# Patient Record
Sex: Female | Born: 1995 | Race: Black or African American | Hispanic: No | Marital: Single | Smoking: Never smoker
Health system: Southern US, Community
[De-identification: ages and names within clinical notes are randomized; demographics above are authoritative.]

## PROBLEM LIST (undated history)

## (undated) DIAGNOSIS — N289 Disorder of kidney and ureter, unspecified: Secondary | ICD-10-CM

## (undated) DIAGNOSIS — I10 Essential (primary) hypertension: Secondary | ICD-10-CM

## (undated) DIAGNOSIS — M329 Systemic lupus erythematosus, unspecified: Secondary | ICD-10-CM

## (undated) DIAGNOSIS — I050 Rheumatic mitral stenosis: Secondary | ICD-10-CM

## (undated) HISTORY — PX: CARDIAC SURGERY: SHX584

## (undated) HISTORY — PX: TONSILLECTOMY: SUR1361

## (undated) HISTORY — PX: OTHER SURGICAL HISTORY: SHX169

## (undated) SURGERY — Surgical Case
Anesthesia: *Unknown

---
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
## 2014-02-08 ENCOUNTER — Encounter (HOSPITAL_COMMUNITY): Payer: Self-pay | Admitting: Emergency Medicine

## 2014-02-08 ENCOUNTER — Emergency Department (HOSPITAL_COMMUNITY)
Admission: EM | Admit: 2014-02-08 | Discharge: 2014-02-09 | Disposition: A | Discharge status: Home / Self Care | Payer: Medicaid Other | Attending: Emergency Medicine | Admitting: Emergency Medicine

## 2014-02-08 ENCOUNTER — Emergency Department (HOSPITAL_COMMUNITY): Payer: Medicaid Other

## 2014-02-08 DIAGNOSIS — R51 Headache: Secondary | ICD-10-CM | POA: Insufficient documentation

## 2014-02-08 DIAGNOSIS — Z7952 Long term (current) use of systemic steroids: Secondary | ICD-10-CM | POA: Insufficient documentation

## 2014-02-08 DIAGNOSIS — R079 Chest pain, unspecified: Secondary | ICD-10-CM | POA: Insufficient documentation

## 2014-02-08 DIAGNOSIS — Z79891 Long term (current) use of opiate analgesic: Secondary | ICD-10-CM | POA: Diagnosis not present

## 2014-02-08 DIAGNOSIS — Z79899 Other long term (current) drug therapy: Secondary | ICD-10-CM | POA: Insufficient documentation

## 2014-02-08 DIAGNOSIS — I10 Essential (primary) hypertension: Secondary | ICD-10-CM | POA: Diagnosis not present

## 2014-02-08 DIAGNOSIS — Z7901 Long term (current) use of anticoagulants: Secondary | ICD-10-CM | POA: Insufficient documentation

## 2014-02-08 DIAGNOSIS — M329 Systemic lupus erythematosus, unspecified: Secondary | ICD-10-CM | POA: Diagnosis not present

## 2014-02-08 DIAGNOSIS — R519 Headache, unspecified: Secondary | ICD-10-CM

## 2014-02-08 HISTORY — DX: Essential (primary) hypertension: I10

## 2014-02-08 LAB — CBC
HCT: 35.1 % — ABNORMAL LOW (ref 36.0–46.0)
Hemoglobin: 11.7 g/dL — ABNORMAL LOW (ref 12.0–15.0)
MCH: 29.6 pg (ref 26.0–34.0)
MCHC: 33.3 g/dL (ref 30.0–36.0)
MCV: 88.9 fL (ref 78.0–100.0)
Platelets: 263 10*3/uL (ref 150–400)
RBC: 3.95 MIL/uL (ref 3.87–5.11)
RDW: 14.2 % (ref 11.5–15.5)
WBC: 5 10*3/uL (ref 4.0–10.5)

## 2014-02-08 LAB — BASIC METABOLIC PANEL
Anion gap: 6 (ref 5–15)
BUN: 11 mg/dL (ref 6–23)
CO2: 26 mmol/L (ref 19–32)
CREATININE: 0.84 mg/dL (ref 0.50–1.10)
Calcium: 9.2 mg/dL (ref 8.4–10.5)
Chloride: 111 mEq/L (ref 96–112)
GFR calc Af Amer: >90 mL/min (ref >90)
Glucose, Bld: 112 mg/dL — ABNORMAL HIGH (ref 70–99)
Potassium: 3.5 mmol/L (ref 3.5–5.1)
Sodium: 143 mmol/L (ref 135–145)

## 2014-02-08 LAB — I-STAT TROPONIN, ED: TROPONIN I, POC: 0.01 ng/mL (ref 0.00–0.08)

## 2014-02-08 LAB — PROTIME-INR
INR: 2.1 — AB (ref 0.00–1.49)
Prothrombin Time: 23.8 seconds — ABNORMAL HIGH (ref 11.6–15.2)

## 2014-02-08 LAB — TROPONIN I

## 2014-02-08 MED ORDER — METOCLOPRAMIDE HCL 5 MG/ML IJ SOLN
10.0000 mg | Freq: Once | INTRAMUSCULAR | Status: AC
  Administered 2014-02-08: 10 mg via INTRAVENOUS
  Filled 2014-02-08: qty 2

## 2014-02-08 MED ORDER — DIPHENHYDRAMINE HCL 50 MG/ML IJ SOLN
25.0000 mg | Freq: Once | INTRAMUSCULAR | Status: AC
  Administered 2014-02-08: 25 mg via INTRAVENOUS
  Filled 2014-02-08: qty 1

## 2014-02-08 NOTE — ED Provider Notes (Signed)
CSN: [id number]     Arrival date & time 02/08/14  1900 History   First MD Initiated Contact with Patient 02/08/14 2202     Chief Complaint  Patient presents with  . Chest Pain    Hx of mitral valve stenosis s/p heart surgery.     (Consider location/radiation/quality/duration/timing/severity/associated sxs/prior Treatment) Patient is a 19 y.o. female presenting with chest pain. The history is provided by the patient and a parent. No language interpreter was used.  Chest Pain Pain location:  L chest Pain quality: sharp   Pain radiates to:  Does not radiate Pain radiates to the back: no   Pain severity:  Moderate Onset quality:  Sudden Duration:  4 days Timing:  Intermittent Progression:  Unchanged Chronicity:  Recurrent (Present intermittently for the past 4 years) Relieved by:  Nothing Worsened by:  Nothing tried Ineffective treatments:  None tried Associated symptoms: no abdominal pain, no anxiety, no cough, no dizziness, no fever, no nausea, no numbness, no shortness of breath, not vomiting and no weakness   Risk factors: no aortic disease, no birth control, no coronary artery disease, no Ehlers-Danlos syndrome, no high cholesterol, no hypertension and not female   Risk factors comment:  Lupus and mitral valve replacement 4 years ago   Past Medical History  Diagnosis Date  . Lupus   . Mitral valve stenosis   . Hypertension    Past Surgical History  Procedure Laterality Date  . Value replacement    . Laparoscopic appendectomy N/A 02/11/2013    Procedure: APPENDECTOMY LAPAROSCOPIC;  Surgeon: Liz Malady, MD;  Location: [facility] OR;  Service: General;  Laterality: N/A;  . Laparoscopic salpingo oopherectomy Right 02/11/2013    Procedure: LAPAROSCOPIC SALPINGO OOPHORECTOMY;  Surgeon: Lazaro Arms, MD;  Location: [facility] OR;  Service: Gynecology;  Laterality: Right;  . Tonsillectomy    . Appendectomy  January 2015  . Esophagogastroduodenoscopy N/A 02/14/2013    Procedure:  ESOPHAGOGASTRODUODENOSCOPY (EGD);  Surgeon: Hart Carwin, MD;  Location: [facility] ENDOSCOPY;  Service: Endoscopy;  Laterality: N/A;  . Cardiac surgery     No family history on file. History  Substance Use Topics  . Smoking status: Never Smoker   . Smokeless tobacco: Never Used  . Alcohol Use: No   OB History    No data available     Review of Systems  Constitutional: Negative for fever.  Respiratory: Negative for cough and shortness of breath.   Cardiovascular: Positive for chest pain.  Gastrointestinal: Negative for nausea, vomiting and abdominal pain.  Neurological: Negative for dizziness, weakness and numbness.  All other systems reviewed and are negative.     Allergies  Review of patient's allergies indicates no known allergies.  Home Medications   Prior to Admission medications   Medication Sig Start Date End Date Taking? Authorizing Provider  amLODipine (NORVASC) 10 MG tablet Take 10 mg by mouth daily.    Historical Provider, MD  amoxicillin-clavulanate (AUGMENTIN) 500-125 MG per tablet Take 1 tablet (500 mg total) by mouth every 8 (eight) hours. 04/22/13   Nelia Shi, MD  Ascorbic Acid (VITAMIN C PO) Take 1 tablet by mouth daily.    Historical Provider, MD  hydroxychloroquine (PLAQUENIL) 200 MG tablet Take 400 mg by mouth daily.    Historical Provider, MD  mycophenolate (MYFORTIC) 360 MG TBEC EC tablet Take 1,080 mg by mouth 2 (two) times daily.    Historical Provider, MD  oxyCODONE-acetaminophen (PERCOCET/ROXICET) 5-325 MG per tablet Take 1 tablet by mouth every  4 (four) hours as needed. 04/23/13   Garlon Hatchet, PA-C  predniSONE (DELTASONE) 5 MG tablet Take 5 mg by mouth daily with breakfast.    Historical Provider, MD  warfarin (COUMADIN) 7.5 MG tablet Take 7.5-11.25 mg by mouth every evening. 7.5mg  Sunday, MOnday, Wednesday, Friday, Saturday 11.25mg  Tuesday, Thursday    Historical Provider, MD   BP 133/85 mmHg  Pulse 88  Temp(Src) 98.4 F (36.9 C) (Oral)  Resp  16  Ht 5\' 2"  (1.575 m)  Wt 180 lb (81.647 kg)  BMI 32.91 kg/m2  SpO2 100%  LMP 01/25/2014 (Approximate) Physical Exam  Constitutional: She is oriented to person, place, and time. She appears well-developed and well-nourished. No distress.  HENT:  Head: Normocephalic and atraumatic.  Eyes: Pupils are equal, round, and reactive to light.  Neck: Normal range of motion.  Cardiovascular: Normal rate, regular rhythm, normal heart sounds and intact distal pulses.   Pulses:      Radial pulses are 2+ on the right side, and 2+ on the left side.  Pulmonary/Chest: Effort normal. No respiratory distress. She has no wheezes. She exhibits no tenderness.  Abdominal: Soft. Bowel sounds are normal. She exhibits no distension. There is no tenderness. There is no rebound and no guarding.  Neurological: She is alert and oriented to person, place, and time. She has normal strength. No cranial nerve deficit or sensory deficit. She exhibits normal muscle tone. Coordination and gait normal.  Skin: Skin is warm and dry.  Nursing note and vitals reviewed.   ED Course  Procedures (including critical care time) Labs Review Labs Reviewed  CBC - Abnormal; Notable for the following:    Hemoglobin 11.7 (*)    HCT 35.1 (*)    All other components within normal limits  BASIC METABOLIC PANEL - Abnormal; Notable for the following:    Glucose, Bld 112 (*)    All other components within normal limits  PROTIME-INR - Abnormal; Notable for the following:    Prothrombin Time 23.8 (*)    INR 2.10 (*)    All other components within normal limits  TROPONIN I  I-STAT TROPOININ, ED    Imaging Review Dg Chest 2 View  02/08/2014   CLINICAL DATA:  Left-sided chest pain radiating into the left shoulder over the past 3 days. Prior mitral valve replacement for mitral stenosis. Current history of hypertension and collagen vascular disease.  EXAM: CHEST  2 VIEW  COMPARISON:  02/16/2013 dating back to 02/10/2013.  FINDINGS:  Suboptimal inspiration due to body habitus accounts for crowded bronchovascular markings, especially in the bases, and accentuates the cardiac silhouette. Taking this into account, cardiac silhouette upper normal in size, unchanged. Prior mitral valve replacement. Hilar and mediastinal contours unremarkable. Lungs clear. Bronchovascular markings normal. Pulmonary vascularity normal. No visible pleural effusions. No pneumothorax. Visualized bony thorax intact.  IMPRESSION: Suboptimal inspiration.  No acute cardiopulmonary disease.   Electronically Signed   By: 02/12/2013 M.D.   On: 02/08/2014 23:19     EKG Interpretation   Date/Time:  Friday February 08 2014 19:10:26 EST Ventricular Rate:  109 PR Interval:  152 QRS Duration: 74 QT Interval:  328 QTC Calculation: 441 R Axis:   55 Text Interpretation:  Sinus tachycardia Possible Left atrial enlargement  Nonspecific ST and T wave abnormality No significant change since last  tracing Confirmed by 05-11-1981  MD, Anitra Lauth (Alphonzo Lemmings) on 02/08/2014 9:57:15 PM      MDM   Final diagnoses:  Chest pain  Nonintractable headache,  unspecified chronicity pattern, unspecified headache type     Patient is a 19 year old African-American female with pertinent past medical history of lupus and a mitral valve replacement 3 years ago on Coumadin for anticoagulation who comes to the emergency Department today with left-sided chest pain which has been intermittent for the past 4 years. Physical exam as above. Initial workup included CBC, BMP, troponin, PT/INR, EKG, chest x-ray. EKG is detailed above. There is no significant change from previous EKGs. The patient has had this pain intermittently for the past several years, the pain is not exertional, and has been present for the past 4 days as a result a single troponin should be sufficient to rule out MI. Initial troponin was negative.  Since the patient is young with atypical symptoms I doubt an MI. BMP was  unremarkable. CBC had hemoglobin of 11.7.  Doubt anemia contributing to her symptoms. INR was therapeutic at 2.1. Patient has never had a DVT or PE. She was initially tachycardic upon arrival however this resolved without intervention. With a therapeutic INR, atypical symptoms, no shortness of breath, I doubt PE at this time. Patient is followed at [facility] for her mitral valve repair. She has follow-up within the next couple months. She was instructed to contact her cardiologist at [facility] next week to ensure that he did not want to see her earlier. Patient was felt to be stable for discharge at this time. She was instructed to return to the emergency Department with worsening of her pain, fevers, shortness of breath, or any other concerns. Patient was discharged in good condition. Labs and imaging were reviewed by myself and considered in medical decision making. Imaging was interpreted by radiology. Care was discussed with my attending Dr. Anitra Lauth.    Bethann Berkshire, MD 02/09/14 1215  Gwyneth Sprout, MD 02/09/14 [phone]

## 2014-02-08 NOTE — ED Notes (Signed)
States I've been having pain in left side of my chest going down my left arm for 3 days on and off.  Hx of Mitral valve stenosis and had surgery in 2012.  Has intermittent chest pain since.

## 2014-02-08 NOTE — ED Notes (Signed)
Dr. Plunkett at bedside.  

## 2014-02-08 NOTE — Discharge Instructions (Signed)

## 2014-05-02 ENCOUNTER — Emergency Department (HOSPITAL_COMMUNITY): Payer: Medicaid Other

## 2014-05-02 ENCOUNTER — Emergency Department (HOSPITAL_COMMUNITY)
Admission: EM | Admit: 2014-05-02 | Discharge: 2014-05-02 | Disposition: A | Discharge status: Home / Self Care | Payer: Medicaid Other | Attending: Emergency Medicine | Admitting: Emergency Medicine

## 2014-05-02 ENCOUNTER — Encounter (HOSPITAL_COMMUNITY): Payer: Self-pay | Admitting: Emergency Medicine

## 2014-05-02 DIAGNOSIS — R079 Chest pain, unspecified: Secondary | ICD-10-CM | POA: Diagnosis present

## 2014-05-02 DIAGNOSIS — I10 Essential (primary) hypertension: Secondary | ICD-10-CM | POA: Diagnosis not present

## 2014-05-02 DIAGNOSIS — R0602 Shortness of breath: Secondary | ICD-10-CM | POA: Diagnosis not present

## 2014-05-02 DIAGNOSIS — R0789 Other chest pain: Secondary | ICD-10-CM | POA: Diagnosis not present

## 2014-05-02 DIAGNOSIS — Z79899 Other long term (current) drug therapy: Secondary | ICD-10-CM | POA: Insufficient documentation

## 2014-05-02 DIAGNOSIS — Z7901 Long term (current) use of anticoagulants: Secondary | ICD-10-CM | POA: Diagnosis not present

## 2014-05-02 DIAGNOSIS — M329 Systemic lupus erythematosus, unspecified: Secondary | ICD-10-CM | POA: Insufficient documentation

## 2014-05-02 LAB — PROTIME-INR
INR: 2.22 — ABNORMAL HIGH (ref 0.00–1.49)
Prothrombin Time: 24.8 seconds — ABNORMAL HIGH (ref 11.6–15.2)

## 2014-05-02 LAB — CBC
HCT: 32.7 % — ABNORMAL LOW (ref 36.0–46.0)
Hemoglobin: 10.3 g/dL — ABNORMAL LOW (ref 12.0–15.0)
MCH: 28.5 pg (ref 26.0–34.0)
MCHC: 31.5 g/dL (ref 30.0–36.0)
MCV: 90.6 fL (ref 78.0–100.0)
Platelets: 295 10*3/uL (ref 150–400)
RBC: 3.61 MIL/uL — ABNORMAL LOW (ref 3.87–5.11)
RDW: 14.8 % (ref 11.5–15.5)
WBC: 4.3 10*3/uL (ref 4.0–10.5)

## 2014-05-02 LAB — I-STAT TROPONIN, ED: Troponin i, poc: 0 ng/mL (ref 0.00–0.08)

## 2014-05-02 LAB — BASIC METABOLIC PANEL
Anion gap: 10 (ref 5–15)
BUN: 24 mg/dL — AB (ref 6–23)
CALCIUM: 8.5 mg/dL (ref 8.4–10.5)
CHLORIDE: 111 mmol/L (ref 96–112)
CO2: 19 mmol/L (ref 19–32)
CREATININE: 0.89 mg/dL (ref 0.50–1.10)
GFR calc Af Amer: >90 mL/min (ref >90)
GFR calc non Af Amer: >90 mL/min (ref >90)
Glucose, Bld: 137 mg/dL — ABNORMAL HIGH (ref 70–99)
Potassium: 3.5 mmol/L (ref 3.5–5.1)
Sodium: 140 mmol/L (ref 135–145)

## 2014-05-02 MED ORDER — OXYCODONE-ACETAMINOPHEN 5-325 MG PO TABS
1.0000 | ORAL_TABLET | Freq: Once | ORAL | Status: AC
  Administered 2014-05-02: 1 via ORAL
  Filled 2014-05-02: qty 1

## 2014-05-02 MED ORDER — OXYCODONE-ACETAMINOPHEN 5-325 MG PO TABS: 1.0000 | ORAL_TABLET | ORAL | Status: DC | PRN

## 2014-05-02 NOTE — ED Notes (Signed)
Pt presents with central chest pressure and shortness of breath that started this morning, denies N/V or recent cough.  Pt has cardiac hx, no acute distress noted at this time.

## 2014-05-02 NOTE — ED Provider Notes (Signed)
CSN: [id number]     Arrival date & time 05/02/14  1728 History   First MD Initiated Contact with Patient 05/02/14 1825     Chief Complaint  Patient presents with  . Chest Pain     (Consider location/radiation/quality/duration/timing/severity/associated sxs/prior Treatment) Patient is a 19 y.o. Miles presenting with chest pain. The history is provided by the patient and medical records.  Chest Pain Associated symptoms: shortness of breath     This is a 19 y.o. F with PMH significant for lupus, mitral valve stenosis s/p surgical repair in 2012, HTN, presenting to the ED for chest pain.  Patient states central chest pressure and SOB which began this morning.  States initially there was some radiation to her left shoulder but this has since resolved. She denies any dizziness, lightheadedness, diaphoresis, nausea, or vomiting. Patient states she does get chest pain intermittently since her heart surgery in 2012. Patient has never been a smoker. She is followed by cardiology at [facility].  Past Medical History  Diagnosis Date  . Lupus   . Mitral valve stenosis   . Hypertension    Past Surgical History  Procedure Laterality Date  . Value replacement    . Laparoscopic appendectomy N/A 02/11/2013    Procedure: APPENDECTOMY LAPAROSCOPIC;  Surgeon: Liz Malady, MD;  Location: [facility] OR;  Service: General;  Laterality: N/A;  . Laparoscopic salpingo oopherectomy Right 02/11/2013    Procedure: LAPAROSCOPIC SALPINGO OOPHORECTOMY;  Surgeon: Lazaro Arms, MD;  Location: [facility] OR;  Service: Gynecology;  Laterality: Right;  . Tonsillectomy    . Appendectomy  January 2015  . Esophagogastroduodenoscopy N/A 02/14/2013    Procedure: ESOPHAGOGASTRODUODENOSCOPY (EGD);  Surgeon: Hart Carwin, MD;  Location: [facility] ENDOSCOPY;  Service: Endoscopy;  Laterality: N/A;  . Cardiac surgery     No family history on file. History  Substance Use Topics  . Smoking status: Never Smoker   . Smokeless tobacco: Never Used  .  Alcohol Use: No   OB History    No data available     Review of Systems  Respiratory: Positive for shortness of breath.   Cardiovascular: Positive for chest pain.  All other systems reviewed and are negative.     Allergies  Review of patient's allergies indicates no known allergies.  Home Medications   Prior to Admission medications   Medication Sig Start Date End Date Taking? Authorizing Provider  amLODipine (NORVASC) 10 MG tablet Take 10 mg by mouth daily.    Historical Provider, MD  amoxicillin-clavulanate (AUGMENTIN) 500-125 MG per tablet Take 1 tablet (500 mg total) by mouth every 8 (eight) hours. 04/22/13   Nelva Nay, MD  Ascorbic Acid (VITAMIN C PO) Take 1 tablet by mouth daily.    Historical Provider, MD  hydroxychloroquine (PLAQUENIL) 200 MG tablet Take 400 mg by mouth daily.    Historical Provider, MD  mycophenolate (MYFORTIC) 360 MG TBEC EC tablet Take 1,080 mg by mouth 2 (two) times daily.    Historical Provider, MD  oxyCODONE-acetaminophen (PERCOCET/ROXICET) 5-325 MG per tablet Take 1 tablet by mouth every 4 (four) hours as needed. 04/23/13   Garlon Hatchet, PA-C  predniSONE (DELTASONE) 5 MG tablet Take 5 mg by mouth daily with breakfast.    Historical Provider, MD  warfarin (COUMADIN) 7.5 MG tablet Take 7.5-11.25 mg by mouth every evening. 7.5mg  Sunday, MOnday, Wednesday, Friday, Saturday 11.25mg  Tuesday, Thursday    Historical Provider, MD   BP 113/82 mmHg  Pulse 95  Temp(Src) 98.2 F (36.8 C) (  Oral)  Resp 17  Ht 5\' 2"  (1.575 m)  Wt 180 lb (81.647 kg)  BMI 32.91 kg/m2  SpO2 98%  LMP 04/17/2014   Physical Exam  Constitutional: She is oriented to person, place, and time. She appears well-developed and well-nourished. No distress.  HENT:  Head: Normocephalic and atraumatic.  Mouth/Throat: Oropharynx is clear and moist.  Eyes: Conjunctivae and EOM are normal. Pupils are equal, round, and reactive to light.  Neck: Normal range of motion. Neck supple.   Cardiovascular: Normal rate, regular rhythm and normal heart sounds.   Pulmonary/Chest: Effort normal and breath sounds normal. No respiratory distress. She has no wheezes.  Abdominal: Soft. Bowel sounds are normal. There is no tenderness. There is no guarding.  Musculoskeletal: Normal range of motion. She exhibits no edema.  Neurological: She is alert and oriented to person, place, and time.  Skin: Skin is warm and dry. She is not diaphoretic.  Psychiatric: She has a normal mood and affect.  Nursing note and vitals reviewed.   ED Course  Procedures (including critical care time) Labs Review Labs Reviewed  CBC - Abnormal; Notable for the following:    RBC 3.61 (*)    Hemoglobin 10.3 (*)    HCT 32.7 (*)    All other components within normal limits  BASIC METABOLIC PANEL - Abnormal; Notable for the following:    Glucose, Bld 137 (*)    BUN 24 (*)    All other components within normal limits  PROTIME-INR - Abnormal; Notable for the following:    Prothrombin Time 24.8 (*)    INR 2.22 (*)    All other components within normal limits  I-STAT TROPOININ, ED    Imaging Review Dg Chest 2 View  05/02/2014   CLINICAL DATA:  Left-sided chest pain and shortness of Breath  EXAM: CHEST  2 VIEW  COMPARISON:  02/08/2014  FINDINGS: Cardiac shadow is within normal limits. Postsurgical changes are again seen consistent with valve replacement. No focal infiltrate or sizable effusion is noted.  IMPRESSION: No active cardiopulmonary disease.   Electronically Signed   By: 02/10/2014 M.D.   On: 05/02/2014 19:34     EKG Interpretation   Date/Time:  Thursday May 02 2014 17:39:05 EDT Ventricular Rate:  96 PR Interval:  156 QRS Duration: 82 QT Interval:  352 QTC Calculation: 444 R Axis:   71 Text Interpretation:  Normal sinus rhythm Normal ECG No significant change  was found Confirmed by CAMPOS  MD, KEVIN (02-09-1991) on 05/02/2014 8:Joan:21 PM      MDM   Final diagnoses:  Chest pain, unspecified  chest pain type   19 y.o. F here with chest pain.  She does have known cardiac hx and has pain frequently since her MV surgery in 2012.  She is followed by cardiology at [facility].  EKG sinus rhythm without acute ischemic changes. Lab work is reassuring, troponin is negative and INR is therapeutic. Chest x-ray is clear. Patient was given dose of Percocet with improvement of her pain. Given negative workup after several hours of pain, have low suspicion for ACS, PE, dissection, or other acute cardiac event at this time. Patient we discharged home with pain medication. She was encouraged to follow-up with her cardiologist.  Discussed plan with patient, he/she acknowledged understanding and agreed with plan of care.  Return precautions given for new or worsening symptoms.  [facility], PA-C 05/02/14 2151  2152, MD 05/03/14 [phone]

## 2014-05-02 NOTE — Discharge Instructions (Signed)
Take the prescribed medication as directed. Follow-up with your cardiologist-- call and schedule this ED visit. Return to the ED for new or worsening symptoms.

## 2014-05-28 ENCOUNTER — Other Ambulatory Visit (HOSPITAL_COMMUNITY): Payer: Self-pay | Admitting: Pediatrics

## 2014-05-28 ENCOUNTER — Other Ambulatory Visit: Payer: Self-pay | Admitting: Pediatrics

## 2014-05-28 DIAGNOSIS — R0789 Other chest pain: Secondary | ICD-10-CM

## 2014-06-17 ENCOUNTER — Ambulatory Visit (HOSPITAL_COMMUNITY): Payer: Medicaid Other

## 2014-06-20 ENCOUNTER — Other Ambulatory Visit: Payer: Self-pay | Admitting: Cardiology

## 2014-07-11 ENCOUNTER — Ambulatory Visit (HOSPITAL_COMMUNITY)
Admission: RE | Admit: 2014-07-11 | Discharge: 2014-07-11 | Disposition: A | Discharge status: Home / Self Care | Payer: Medicaid Other | Source: Ambulatory Visit | Attending: Pediatrics | Admitting: Pediatrics

## 2014-07-11 ENCOUNTER — Encounter (HOSPITAL_COMMUNITY): Payer: Self-pay

## 2014-07-11 DIAGNOSIS — I2541 Coronary artery aneurysm: Secondary | ICD-10-CM | POA: Diagnosis not present

## 2014-07-11 DIAGNOSIS — R0789 Other chest pain: Secondary | ICD-10-CM | POA: Insufficient documentation

## 2014-07-11 DIAGNOSIS — I2510 Atherosclerotic heart disease of native coronary artery without angina pectoris: Secondary | ICD-10-CM | POA: Diagnosis not present

## 2014-07-11 DIAGNOSIS — M329 Systemic lupus erythematosus, unspecified: Secondary | ICD-10-CM | POA: Insufficient documentation

## 2014-07-11 DIAGNOSIS — Z952 Presence of prosthetic heart valve: Secondary | ICD-10-CM | POA: Insufficient documentation

## 2014-07-11 HISTORY — DX: Disorder of kidney and ureter, unspecified: N28.9

## 2014-07-11 MED ORDER — METOPROLOL TARTRATE 50 MG PO TABS
ORAL_TABLET | ORAL | Status: AC
  Administered 2014-07-11: 50 mg via ORAL
  Filled 2014-07-11: qty 1

## 2014-07-11 MED ORDER — METOPROLOL TARTRATE 1 MG/ML IV SOLN
INTRAVENOUS | Status: AC
  Administered 2014-07-11: 5 mg via INTRAVENOUS
  Filled 2014-07-11: qty 5

## 2014-07-11 MED ORDER — METOPROLOL TARTRATE 50 MG PO TABS
50.0000 mg | ORAL_TABLET | Freq: Once | ORAL | Status: AC
  Administered 2014-07-11: 50 mg via ORAL
  Filled 2014-07-11: qty 1

## 2014-07-11 MED ORDER — METOPROLOL TARTRATE 50 MG PO TABS
50.0000 mg | ORAL_TABLET | Freq: Once | ORAL | Status: AC
  Administered 2014-07-11: 50 mg via ORAL

## 2014-07-11 MED ORDER — NITROGLYCERIN 0.4 MG SL SUBL
SUBLINGUAL_TABLET | SUBLINGUAL | Status: AC
  Administered 2014-07-11: 0.4 mg via SUBLINGUAL
  Filled 2014-07-11: qty 1

## 2014-07-11 MED ORDER — METOPROLOL TARTRATE 1 MG/ML IV SOLN
5.0000 mg | Freq: Once | INTRAVENOUS | Status: AC
  Administered 2014-07-11: 5 mg via INTRAVENOUS

## 2014-07-11 MED ORDER — NITROGLYCERIN 0.4 MG SL SUBL
0.4000 mg | SUBLINGUAL_TABLET | Freq: Once | SUBLINGUAL | Status: AC
  Administered 2014-07-11: 0.4 mg via SUBLINGUAL

## 2014-07-11 MED ORDER — METOPROLOL TARTRATE 50 MG PO TABS
ORAL_TABLET | ORAL | Status: DC
Start: 2014-07-11 — End: 2014-07-12
  Filled 2014-07-11: qty 1

## 2014-07-11 MED ORDER — IOHEXOL 350 MG/ML SOLN
80.0000 mL | Freq: Once | INTRAVENOUS | Status: AC | PRN
  Administered 2014-07-11: 80 mL via INTRAVENOUS

## 2014-07-11 NOTE — Progress Notes (Signed)
Pt transported to CT room 1 for procedure. AAOx4, denies any dizziness. VSS following three staggered PO 50 mg doses of Metoprolol given in nurses station per order Dr. Llana Aliment.

## 2014-07-22 ENCOUNTER — Emergency Department (HOSPITAL_COMMUNITY): Payer: Medicaid Other

## 2014-07-22 ENCOUNTER — Encounter (HOSPITAL_COMMUNITY): Payer: Self-pay | Admitting: Emergency Medicine

## 2014-07-22 ENCOUNTER — Emergency Department (HOSPITAL_COMMUNITY)
Admission: EM | Admit: 2014-07-22 | Discharge: 2014-07-23 | Disposition: A | Discharge status: Home / Self Care | Payer: Medicaid Other | Attending: Emergency Medicine | Admitting: Emergency Medicine

## 2014-07-22 DIAGNOSIS — Z954 Presence of other heart-valve replacement: Secondary | ICD-10-CM | POA: Insufficient documentation

## 2014-07-22 DIAGNOSIS — Z8742 Personal history of other diseases of the female genital tract: Secondary | ICD-10-CM | POA: Insufficient documentation

## 2014-07-22 DIAGNOSIS — I10 Essential (primary) hypertension: Secondary | ICD-10-CM | POA: Diagnosis not present

## 2014-07-22 DIAGNOSIS — Z79899 Other long term (current) drug therapy: Secondary | ICD-10-CM | POA: Diagnosis not present

## 2014-07-22 DIAGNOSIS — Z9889 Other specified postprocedural states: Secondary | ICD-10-CM | POA: Diagnosis not present

## 2014-07-22 DIAGNOSIS — R791 Abnormal coagulation profile: Secondary | ICD-10-CM | POA: Insufficient documentation

## 2014-07-22 DIAGNOSIS — M546 Pain in thoracic spine: Secondary | ICD-10-CM | POA: Insufficient documentation

## 2014-07-22 DIAGNOSIS — M329 Systemic lupus erythematosus, unspecified: Secondary | ICD-10-CM | POA: Insufficient documentation

## 2014-07-22 DIAGNOSIS — M25512 Pain in left shoulder: Secondary | ICD-10-CM | POA: Insufficient documentation

## 2014-07-22 DIAGNOSIS — Z7901 Long term (current) use of anticoagulants: Secondary | ICD-10-CM | POA: Insufficient documentation

## 2014-07-22 DIAGNOSIS — R0602 Shortness of breath: Secondary | ICD-10-CM | POA: Insufficient documentation

## 2014-07-22 DIAGNOSIS — R7989 Other specified abnormal findings of blood chemistry: Secondary | ICD-10-CM

## 2014-07-22 LAB — CBC WITH DIFFERENTIAL/PLATELET
BASOS ABS: 0 10*3/uL (ref 0.0–0.1)
Basophils Relative: 0 % (ref 0–1)
Eosinophils Absolute: 0 10*3/uL (ref 0.0–0.7)
Eosinophils Relative: 1 % (ref 0–5)
HCT: 35.8 % — ABNORMAL LOW (ref 36.0–46.0)
Hemoglobin: 11.7 g/dL — ABNORMAL LOW (ref 12.0–15.0)
LYMPHS ABS: 1.3 10*3/uL (ref 0.7–4.0)
LYMPHS PCT: 28 % (ref 12–46)
MCH: 28.9 pg (ref 26.0–34.0)
MCHC: 32.7 g/dL (ref 30.0–36.0)
MCV: 88.4 fL (ref 78.0–100.0)
Monocytes Absolute: 0.4 10*3/uL (ref 0.1–1.0)
Monocytes Relative: 9 % (ref 3–12)
NEUTROS ABS: 2.9 10*3/uL (ref 1.7–7.7)
NEUTROS PCT: 62 % (ref 43–77)
PLATELETS: 287 10*3/uL (ref 150–400)
RBC: 4.05 MIL/uL (ref 3.87–5.11)
RDW: 14.1 % (ref 11.5–15.5)
WBC: 4.7 10*3/uL (ref 4.0–10.5)

## 2014-07-22 LAB — BASIC METABOLIC PANEL
Anion gap: 9 (ref 5–15)
BUN: 21 mg/dL — AB (ref 6–20)
CALCIUM: 9.1 mg/dL (ref 8.9–10.3)
CO2: 21 mmol/L — ABNORMAL LOW (ref 22–32)
Chloride: 111 mmol/L (ref 101–111)
Creatinine, Ser: 0.86 mg/dL (ref 0.44–1.00)
Glucose, Bld: 84 mg/dL (ref 65–99)
POTASSIUM: 3.7 mmol/L (ref 3.5–5.1)
Sodium: 141 mmol/L (ref 135–145)

## 2014-07-22 LAB — I-STAT TROPONIN, ED: Troponin i, poc: 0 ng/mL (ref 0.00–0.08)

## 2014-07-22 LAB — D-DIMER, QUANTITATIVE (NOT AT ARMC): D-Dimer, Quant: 3.25 ug/mL-FEU — ABNORMAL HIGH (ref 0.00–0.48)

## 2014-07-22 LAB — PROTIME-INR
INR: 1.09 (ref 0.00–1.49)
PROTHROMBIN TIME: 14.3 s (ref 11.6–15.2)

## 2014-07-22 MED ORDER — SODIUM CHLORIDE 0.9 % IV BOLUS (SEPSIS)
1000.0000 mL | Freq: Once | INTRAVENOUS | Status: AC
  Administered 2014-07-23: 1000 mL via INTRAVENOUS

## 2014-07-22 MED ORDER — HYDROCODONE-ACETAMINOPHEN 5-325 MG PO TABS
1.0000 | ORAL_TABLET | Freq: Once | ORAL | Status: AC
Start: 2014-07-22 — End: 2014-07-22
  Administered 2014-07-22: 1 via ORAL
  Filled 2014-07-22: qty 1

## 2014-07-22 NOTE — ED Notes (Signed)
Mercedes PA asks that pt be up triaged to a 3

## 2014-07-22 NOTE — ED Notes (Signed)
Pt. reports left upper back pain onset last week with mild SOB , denies injury , no cough or congestion .

## 2014-07-22 NOTE — ED Notes (Signed)
Pt transported to xray 

## 2014-07-22 NOTE — ED Provider Notes (Signed)
Care assumed from  [place], [place].  Joan Miles is a 19 y.o. female presents with chest pain, back pain and SOB.  Pt with 1 week of left upper back pain which began suddenly.  Pt denies recent prolonged travel/surgery/immobilization, hx of CA, OCP use, and h/o DVT/PE. She denies immediate FHx of MI or DVT/PE.  Denies hemoptysis. Pt's mother reports that she carries a heavy bookbag on her left shoulder.  She is compliant with her warfarin.       Physical Exam  BP 145/101 mmHg  Pulse 88  Temp(Src) 98.8 F (37.1 C) (Oral)  Resp 20  Ht 5\' 2"  (1.575 m)  Wt 185 lb 7 oz (84.114 kg)  BMI 33.91 kg/m2  SpO2 100%  LMP 07/22/2014  Physical Exam   Face to face Exam:   General: Awake  HEENT: Atraumatic  Resp: Normal effort  Cardiac: harsh closing murmur consistent with mechanical valve.   Abd: Nondistended  Neuro:No focal weakness  Lymph: No adenopathy   ED Course  Procedures  MDM Joan Miles presents with pain in her back with associated SOB.    Plan: If all labs and CXR normal pt can be d/c home with naproxyn for pain control.    10:45PM D-dimer significantly elevated.  Will obtain CT angio chest.  CXR with low lung volumes without acute pulmonary process.  Also of note, pt is subtherapeutic on her coumadin.    Case discussed with Renaldo Harrison, PA-C who will follow CT scan with plan for d/c home.    Bebe Liter Donaldo Teegarden, PA-C 07/23/14 0205  07/25/14, MD 07/23/14 [phone]

## 2014-07-22 NOTE — ED Provider Notes (Signed)
CSN: [id number]     Arrival date & time 07/22/14  2113 History  This chart was scribed for non-physician practitioner, Allen Derry, PA-C working with Arby Barrette, MD, by Abel Presto, ED Scribe. This patient was seen in room TR07C/TR07C and the patient's care was started at 9:43 PM.       Chief Complaint  Patient presents with  . Back Pain     Patient is a 19 y.o. female presenting with back pain. The history is provided by the patient and a parent. No language interpreter was used.  Back Pain Location:  Thoracic spine Quality: sharp. Radiates to:  Does not radiate Pain severity:  Moderate Pain is:  Same all the time Onset quality:  Sudden Duration:  1 week Timing:  Constant Progression:  Unchanged Chronicity:  New Context comment:  Unable to specify Relieved by:  Nothing Worsened by:  Deep breathing and lying down Ineffective treatments:  Narcotics Associated symptoms: no abdominal pain, no bladder incontinence, no bowel incontinence, no chest pain, no dysuria, no fever, no headaches, no numbness, no paresthesias, no perianal numbness, no tingling and no weakness    HPI Comments: Joan Miles is a 19 y.o. female with PMHx of lupus, mitral valve stenosis with valve replacement surgery, HTN, and renal insufficiency, who presents to the Emergency Department complaining of 8/10 constant sharp upper left back pain with sudden onset 1 week ago, nonradiating, worse with deep inspiration and lying down, and somewhat relieved with Percocet. Pt is unsure of what she was doing at onset. She notes some SOB as well.  Pt denies recent prolonged travel/surgery/immobilization, hx of CA, OCP use, and h/o DVT/PE. She denies immediate FHx of MI or DVT/PE. Pt has NKDA. Pt denies cough, wheezing, hemoptysis, neck pain, jaw pain, claudication, orthopnea/PND, leg swelling, lightheadedness, dizziness, diaphoresis, extremity pain, fever, chills, nausea, vomiting, diarrhea, abdominal pain,  constipation, hematochezia, melena, chest pain, difficulty urinating, dysuria, hematuria, cauda equina symptoms, numbness, tingling, and weakness.  Denies recent heavy lifting or twisting, but pt's mother reports pt carries a heavy book bag on her left side for prolonged periods of time. She is compliant with her medications, including warfarin, but states that she is no longer on any BP meds. Her Cardiologist is at [facility]).   Past Medical History  Diagnosis Date  . Lupus   . Mitral valve stenosis   . Hypertension   . Renal insufficiency    Past Surgical History  Procedure Laterality Date  . Value replacement    . Laparoscopic appendectomy N/A 02/11/2013    Procedure: APPENDECTOMY LAPAROSCOPIC;  Surgeon: Liz Malady, MD;  Location: [facility] OR;  Service: General;  Laterality: N/A;  . Laparoscopic salpingo oopherectomy Right 02/11/2013    Procedure: LAPAROSCOPIC SALPINGO OOPHORECTOMY;  Surgeon: Lazaro Arms, MD;  Location: [facility] OR;  Service: Gynecology;  Laterality: Right;  . Tonsillectomy    . Appendectomy  January 2015  . Esophagogastroduodenoscopy N/A 02/14/2013    Procedure: ESOPHAGOGASTRODUODENOSCOPY (EGD);  Surgeon: Hart Carwin, MD;  Location: [facility] ENDOSCOPY;  Service: Endoscopy;  Laterality: N/A;  . Cardiac surgery     No family history on file. History  Substance Use Topics  . Smoking status: Never Smoker   . Smokeless tobacco: Never Used  . Alcohol Use: No   OB History    No data available     Review of Systems  Constitutional: Negative for fever, chills and diaphoresis.  Eyes: Negative for visual disturbance.  Respiratory: Positive for shortness of breath.  Negative for cough and wheezing.   Cardiovascular: Negative for chest pain and leg swelling.  Gastrointestinal: Negative for nausea, vomiting, abdominal pain, diarrhea, constipation, blood in stool and bowel incontinence.  Genitourinary: Negative for bladder incontinence, dysuria, hematuria and difficulty urinating.   Musculoskeletal: Positive for myalgias and back pain. Negative for neck pain.  Skin: Negative for color change.  Allergic/Immunologic: Negative for immunocompromised state.  Neurological: Negative for dizziness, tingling, weakness, light-headedness, numbness, headaches and paresthesias.  Hematological: Bruises/bleeds easily (on warfarin).  Psychiatric/Behavioral: Negative for confusion.  10 Systems reviewed and are negative for acute change except as noted in the HPI.     Allergies  Review of patient's allergies indicates no known allergies.  Home Medications   Prior to Admission medications   Medication Sig Start Date End Date Taking? Authorizing Provider  amLODipine (NORVASC) 10 MG tablet Take 10 mg by mouth daily.    Historical Provider, MD  amoxicillin-clavulanate (AUGMENTIN) 500-125 MG per tablet Take 1 tablet (500 mg total) by mouth every 8 (eight) hours. Patient not taking: Reported on 05/02/2014 04/22/13   Nelva Nay, MD  Ascorbic Acid (VITAMIN C PO) Take 1 tablet by mouth daily.    Historical Provider, MD  Calcium Carbonate-Vitamin D 600-400 MG-UNIT per tablet Take 1 tablet by mouth. 600-400mg -unit 03/20/14 03/20/15  Historical Provider, MD  hydroxychloroquine (PLAQUENIL) 200 MG tablet Take 400 mg by mouth daily.    Historical Provider, MD  lisinopril (PRINIVIL,ZESTRIL) 10 MG tablet Take 10 mg by mouth. 05/17/14   Historical Provider, MD  Multiple Vitamin (MULTI-VITAMINS) TABS Take 1 tablet by mouth.    Historical Provider, MD  mycophenolate (MYFORTIC) 360 MG TBEC EC tablet Take 1,080 mg by mouth 2 (two) times daily.    Historical Provider, MD  oxyCODONE-acetaminophen (PERCOCET/ROXICET) 5-325 MG per tablet Take 1 tablet by mouth every 4 (four) hours as needed. Patient not taking: Reported on 07/11/2014 05/02/14   Garlon Hatchet, PA-C  predniSONE (DELTASONE) 5 MG tablet Take 5 mg by mouth every other day.     Historical Provider, MD  warfarin (COUMADIN) 7.5 MG tablet Take 7.5 mg by  mouth daily.    Historical Provider, MD   BP 152/103 mmHg  Pulse 89  Temp(Src) 99.1 F (37.3 C) (Oral)  Resp 16  Ht  (1.575 m)  Wt 185 lb 7 oz (84.114 kg)  BMI 33.91 kg/m2  SpO2 100%  LMP 07/22/2014 Physical Exam  Constitutional: She is oriented to person, place, and time. She appears well-developed and well-nourished.  Non-toxic appearance. No distress.  Afebrile, nontoxic, NAD. Mildly hypertensive  HENT:  Head: Normocephalic and atraumatic.  Mouth/Throat: Oropharynx is clear and moist and mucous membranes are normal.  Eyes: Conjunctivae and EOM are normal. Right eye exhibits no discharge. Left eye exhibits no discharge.  Neck: Normal range of motion. Neck supple. No JVD present. No spinous process tenderness and no muscular tenderness present. No rigidity. Normal range of motion present.  FROM intact without spinous process TTP, no bony stepoffs or deformities, no paraspinous muscle TTP or muscle spasms. No rigidity or meningeal signs. No bruising or swelling. No JVD  Cardiovascular: Normal rate, regular rhythm, normal heart sounds and intact distal pulses.  Exam reveals no gallop and no friction rub.   No murmur heard. RRR, nl s1/s2, no m/r/g, distal pulses intact, no pedal edema   Pulmonary/Chest: Effort normal and breath sounds normal. No respiratory distress. She has no decreased breath sounds. She has no wheezes. She has no rhonchi. She  has no rales. She exhibits no tenderness, no crepitus, no deformity and no retraction.  CTAB in all lung fields, no w/r/r, no hypoxia or increased WOB, speaking in full sentences, SpO2 100% on RA  No chest wall tenderness, crepitus, retraction, or deformity  Abdominal: Soft. Normal appearance and bowel sounds are normal. She exhibits no distension. There is no tenderness. There is no rigidity, no rebound, no guarding, no CVA tenderness, no tenderness at McBurney's point and negative Murphy's sign.  Musculoskeletal: Normal range of motion.        Left shoulder: She exhibits tenderness (posteriorly). She exhibits normal range of motion, no bony tenderness and no swelling.       Arms: L shoulder with FROM intact, no bony TTP, with mild posterior parascapular muscular TTP with palpable spasms, no swelling/effusion, no crepitus/deformity, negative apley scratch, neg pain with resisted int/ext rotation, neg empty can test. Strength and sensation grossly intact in all extremities, distal pulses intact.  All spinal levels nonTTP with no bony tenderness or crepitus, no deformities.  Neurological: She is alert and oriented to person, place, and time. She has normal strength. No sensory deficit. Gait normal.  Skin: Skin is warm, dry and intact. No rash noted.  Psychiatric: She has a normal mood and affect.  Nursing note and vitals reviewed.   ED Course  Procedures (including critical care time) DIAGNOSTIC STUDIES: Oxygen Saturation is 100% on room air, normal by my interpretation.    COORDINATION OF CARE: 9:54 PM Discussed treatment plan with patient at beside, the patient agrees with the plan and has no further questions at this time.   Labs Review Labs Reviewed  CBC WITH DIFFERENTIAL/PLATELET - Abnormal; Notable for the following:    Hemoglobin 11.7 (*)    HCT 35.8 (*)    All other components within normal limits  PROTIME-INR  BASIC METABOLIC PANEL  D-DIMER, QUANTITATIVE (NOT AT [facility])  I-STAT TROPOININ, ED    Imaging Review No results found.   EKG Interpretation None      MDM   Final diagnoses:  Shortness of breath  Left-sided thoracic back pain    19 y.o. female here with L posterior back pain next to shoulder blade x1wk associated with SOB and inspiratory pain. Hx of MV replacement on warfarin. No tachycardia or hypoxia, no s/sx of DVT, but given her cardiac history will obtain labs, EKG, CXR, dimer, and INR. Pain seems reproducible in musculature adjacent to scapula, and pt has her backpack on this shoulder often,  therefore could be MSK pain but again, given the hx, will get cardiac work up. Will give pain meds and reassess shortly.   I personally performed the services described in this documentation, which was scribed in my presence. The recorded information has been reviewed and is accurate.   10:59 PM Care signed over to [facility] PA-C at shift change. So far EKG unremarkable, CBC showing mild stable anemia, INR subtherapeutic. As long as remainder of pending labs and CXR are fine she can go home with MSK type pain meds and f/up with her PCP and cardiologist. Please see Hannah's note for further documentation of care.  BP 145/101 mmHg  Pulse 88  Temp(Src) 98.8 F (37.1 C) (Oral)  Resp 20  Ht 5\' 2"  (1.575 m)  Wt 185 lb 7 oz (84.114 kg)  BMI 33.91 kg/m2  SpO2 100%  LMP 07/22/2014  Meds ordered this encounter  Medications  . HYDROcodone-acetaminophen (NORCO/VICODIN) 5-325 MG per tablet 1 tablet  SigAllen Derry, PA-C 07/22/14 2302  Arby Barrette, MD 07/23/14 [phone]

## 2014-07-23 ENCOUNTER — Encounter (HOSPITAL_COMMUNITY): Payer: Self-pay

## 2014-07-23 ENCOUNTER — Emergency Department (HOSPITAL_COMMUNITY): Payer: Medicaid Other

## 2014-07-23 MED ORDER — IOHEXOL 350 MG/ML SOLN
80.0000 mL | Freq: Once | INTRAVENOUS | Status: AC | PRN
  Administered 2014-07-23: 60 mL via INTRAVENOUS

## 2014-07-23 NOTE — ED Provider Notes (Signed)
Patient care assumed from [facility], PA-C pending CT angio results.  Results for orders placed or performed during the hospital encounter of 07/22/14  CBC with Differential  Result Value Ref Range   WBC 4.7 4.0 - 10.5 K/uL   RBC 4.05 3.87 - 5.11 MIL/uL   Hemoglobin 11.7 (L) 12.0 - 15.0 g/dL   HCT 00.1 (L) 74.9 - 44.9 %   MCV 88.4 78.0 - 100.0 fL   MCH 28.9 26.0 - 34.0 pg   MCHC 32.7 30.0 - 36.0 g/dL   RDW 67.5 91.6 - 38.4 %   Platelets 287 150 - 400 K/uL   Neutrophils Relative % 62 43 - 77 %   Neutro Abs 2.9 1.7 - 7.7 K/uL   Lymphocytes Relative 28 12 - 46 %   Lymphs Abs 1.3 0.7 - 4.0 K/uL   Monocytes Relative 9 3 - 12 %   Monocytes Absolute 0.4 0.1 - 1.0 K/uL   Eosinophils Relative 1 0 - 5 %   Eosinophils Absolute 0.0 0.0 - 0.7 K/uL   Basophils Relative 0 0 - 1 %   Basophils Absolute 0.0 0.0 - 0.1 K/uL  Basic metabolic panel  Result Value Ref Range   Sodium 141 135 - 145 mmol/L   Potassium 3.7 3.5 - 5.1 mmol/L   Chloride 111 101 - 111 mmol/L   CO2 21 (L) 22 - 32 mmol/L   Glucose, Bld 84 65 - 99 mg/dL   BUN 21 (H) 6 - 20 mg/dL   Creatinine, Ser 6.65 0.44 - 1.00 mg/dL   Calcium 9.1 8.9 - 99.3 mg/dL   GFR calc non Af Amer >60 >60 mL/min   GFR calc Af Amer >60 >60 mL/min   Anion gap 9 5 - 15  D-dimer, quantitative  Result Value Ref Range   D-Dimer, Quant 3.25 (H) 0.00 - 0.48 ug/mL-FEU  Protime-INR  Result Value Ref Range   Prothrombin Time 14.3 11.6 - 15.2 seconds   INR 1.09 0.00 - 1.49  I-stat troponin, ED  Result Value Ref Range   Troponin i, poc 0.00 0.00 - 0.08 ng/mL   Comment 3           Dg Chest 2 View  07/22/2014   CLINICAL DATA:  Chest pain and shortness of breath for 1 week.  EXAM: CHEST  2 VIEW  COMPARISON:  Radiographs 05/02/2014 cardiac chest CT 07/11/2014  FINDINGS: Post median sternotomy with prosthetic mitral valve. Lung volumes are low. The cardiomediastinal contours are normal. Pulmonary vasculature is normal. No consolidation, pleural  effusion, or pneumothorax. No acute osseous abnormalities are seen.  IMPRESSION: Low lung volumes without acute pulmonary process.   Electronically Signed   By: Rubye Oaks M.D.   On: 07/22/2014 23:28   Ct Angio Chest Pe W/cm &/or Wo Cm  07/23/2014   CLINICAL DATA:  Left back and shoulder pain.  Dyspnea.  EXAM: CT ANGIOGRAPHY CHEST WITH CONTRAST  TECHNIQUE: Multidetector CT imaging of the chest was performed using the standard protocol during bolus administration of intravenous contrast. Multiplanar CT image reconstructions and MIPs were obtained to evaluate the vascular anatomy.  CONTRAST:  65mL OMNIPAQUE IOHEXOL 350 MG/ML SOLN  COMPARISON:  None.  FINDINGS: Cardiovascular: There is good opacification of the pulmonary arteries. There is no pulmonary embolism. The thoracic aorta is normal in caliber and intact.  Lungs: Clear  Central airways: Patent  Effusions: None  Lymphadenopathy: None  Esophagus: Unremarkable  Upper abdomen: No significant abnormality  Musculoskeletal: No significant abnormality  Review of the MIP images confirms the above findings.  IMPRESSION: Negative for pulmonary embolism.  No significant abnormality.   Electronically Signed   By: Ellery Plunk M.D.   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  EXAM: CT ANGIOGRAPHY OF THE HEART, CORONARY ARTERY, STRUCTURE, AND MORPHOLOGY  PROCEDURE: CT angiography of the coronary vessels was performed on a 256 channel system using prospective ECG gating. A scout and ECG-gated noncontrast exam (for calcium scoring) were performed. Appropriate delay was determined by bolus tracking after injection of iodinated contrast, and an ECG-gated coronary CTA was performed with sub-mm slice  collimation during late diastole. Imaging post processing was performed on an independent workstation creating multiplanar and 3-D images, allowing for quantitative analysis of the heart and coronary arteries. Note that this exam targets the heart and the chest was not imaged in its entirety.  COMPARISON:  None.  MEDICATIONS: Lopressor:  150  mg, P.O.  Lopressor:  10  mg, IV  Nitroglycerin 400 mcg, sublingual  FINDINGS: Technical quality: Sub optimal secondary to patient's elevated heart rate at the time of image acquisition (despite aggressive beta blockade).  Heart rate: Heart rate 78 beats per minute at time of image acquisition  CORONARY ARTERIES:  Left Main: Normal caliber measuring 5 mm in diameter. No atherosclerosis or significant stenosis.  LAD: Diffusely aneurysmal and ectatic vessel, measuring up to 8.3 proximally. Mid LAD is also dilated measuring up to 6 mm in diameter. At the transition of mid to distal LAD the lumen abruptly narrows to 2.5 mm in mean diameter, and subsequently dilates to 5.9 mm in mean diameter. There does appear to be some atheromatous plaque, particularly in the mid to distal LAD, however, confirmation of this is challenging secondary to extensive image noise related to the patient's large body habitus.  Diagonals: Diagonal 1 is ectatic proximally, measuring up to 4 mm in mean diameter.  LCx: Normal caliber vessel, poorly evaluated secondary to motion. Within the well visualized portions of the vessel, no significant atherosclerotic disease identified.  OMs:  Normal caliber vessels poorly evaluated secondary to motion.  RCA: Normal caliber vessel, poorly evaluated secondary to cardiac motion. Within the well visualized portions of the vessel, no significant atherosclerotic disease is identified.  PDA:  Patent.  Dominance:  Codominant.  CORONARY CALCIUM: Total Agatston Score:  0  MESA database percentile:  N/A  AORTA AND PULMONARY MEASUREMENTS:  Aortic root (21 - 40 mm):  23  mm at  the annulus  32  mm at the sinuses of Valsalva  23  mm at the sinotubular junction  Ascending aorta: (< 40 mm):  23 mm  Descending aorta:  (< 40 mm):  20 mm  Main pulmonary artery: (< 30 mm):  22 mm  OTHER FINDINGS: Mechanical mitral valve noted, without obvious valve associated pannus or thrombus on this prospectively triggered acquisition. Within the visualized portions of the thorax there are no suspicious appearing pulmonary nodules or masses, there is no acute consolidative airspace disease, no pleural effusion and no lymphadenopathy. Mild thickening of the superior aspect of the left major fissure is incidentally noted, likely some mild chronic scarring. Median sternotomy wires. Visualized portions of the upper abdomen are unremarkable. There are no aggressive appearing lytic or blastic lesions noted in the visualized  portions of the skeleton.  IMPRESSION: 1. 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 This is most severe proximally where the mean diameter is up to 8.3 mm. At the transition from middle to distal third of the left anterior descending coronary artery there is an abrupt area of narrowing where the mean diameter drops to 2.5 mm. Although this appears very narrowed in this patient relative to the preceding and following aneurysmal segments of the vessel, this is likely a normal caliber segment of the vessel, and the hemodynamic significance of this area of relative narrowing is uncertain in this patient. The presence of atheromatous plaque is suspected, particularly in the mid to distal left anterior descending coronary artery, but is difficult to confirm secondary to images are issues with image quality on today's motion limited examination. 2. Patient's total coronary calcium score is 0. 3. Additional incidental findings, as above. These results were called by telephone at the time of  interpretation on 07/11/2014 at 5:09 pm to Dr. Darlis Loan, who verbally acknowledged these results.   Electronically Signed   By: Trudie Reed M.D.   On: 07/11/2014 17:12    1. Shortness of breath   2. Left-sided thoracic back pain   3. SOB (shortness of breath)   4. Elevated d-dimer    Discussed results with patient. Discussed importance of follow up with PCP for getting coumadin therapeutic. Patient is stable at time of discharge   Francee Piccolo, PA-C 07/23/14 [id number]  Arby Barrette, MD 07/23/14 [phone]

## 2014-07-23 NOTE — Discharge Instructions (Signed)
Please follow up with your primary care doctor this morning to discuss redosing of your Warfarin. Please read all discharge instructions and return precautions.   Shortness of Breath Shortness of breath means you have trouble breathing. It could also mean that you have a medical problem. You should get immediate medical care for shortness of breath. CAUSES   Not enough oxygen in the air such as with high altitudes or a smoke-filled room.  Certain lung diseases, infections, or problems.  Heart disease or conditions, such as angina or heart failure.  Low red blood cells (anemia).  Poor physical fitness, which can cause shortness of breath when you exercise.  Chest or back injuries or stiffness.  Being overweight.  Smoking.  Anxiety, which can make you feel like you are not getting enough air. DIAGNOSIS  Serious medical problems can often be found during your physical exam. Tests may also be done to determine why you are having shortness of breath. Tests may include:  Chest X-rays.  Lung function tests.  Blood tests.  An electrocardiogram (ECG).  An ambulatory electrocardiogram. An ambulatory ECG records your heartbeat patterns over a 24-hour period.  Exercise testing.  A transthoracic echocardiogram (TTE). During echocardiography, sound waves are used to evaluate how blood flows through your heart.  A transesophageal echocardiogram (TEE).  Imaging scans. Your health care provider may not be able to find a cause for your shortness of breath after your exam. In this case, it is important to have a follow-up exam with your health care provider as directed.  TREATMENT  Treatment for shortness of breath depends on the cause of your symptoms and can vary greatly. HOME CARE INSTRUCTIONS   Do not smoke. Smoking is a common cause of shortness of breath. If you smoke, ask for help to quit.  Avoid being around chemicals or things that may bother your breathing, such as paint  fumes and dust.  Rest as needed. Slowly resume your usual activities.  If medicines were prescribed, take them as directed for the full length of time directed. This includes oxygen and any inhaled medicines.  Keep all follow-up appointments as directed by your health care provider. SEEK MEDICAL CARE IF:   Your condition does not improve in the time expected.  You have a hard time doing your normal activities even with rest.  You have any new symptoms. SEEK IMMEDIATE MEDICAL CARE IF:   Your shortness of breath gets worse.  You feel light-headed, faint, or develop a cough not controlled with medicines.  You start coughing up blood.  You have pain with breathing.  You have chest pain or pain in your arms, shoulders, or abdomen.  You have a fever.  You are unable to walk up stairs or exercise the way you normally do. MAKE SURE YOU:  Understand these instructions.  Will watch your condition.  Will get help right away if you are not doing well or get worse. Document Released: 10/06/2000 Document Revised: 01/16/2013 Document Reviewed: 03/29/2011 [facility] Patient Information 2015 [place], [place]. This information is not intended to replace advice given to you by your health care provider. Make sure you discuss any questions you have with your health care provider.

## 2014-08-20 ENCOUNTER — Ambulatory Visit (HOSPITAL_COMMUNITY): Payer: Medicaid Other

## 2014-09-05 ENCOUNTER — Encounter (HOSPITAL_COMMUNITY): Payer: Self-pay | Admitting: Emergency Medicine

## 2014-09-05 ENCOUNTER — Emergency Department (HOSPITAL_COMMUNITY): Payer: Medicaid Other

## 2014-09-05 ENCOUNTER — Emergency Department (HOSPITAL_COMMUNITY)
Admission: EM | Admit: 2014-09-05 | Discharge: 2014-09-06 | Disposition: A | Discharge status: Home / Self Care | Payer: Self-pay | Attending: Emergency Medicine | Admitting: Emergency Medicine

## 2014-09-05 ENCOUNTER — Emergency Department (HOSPITAL_COMMUNITY): Payer: Self-pay

## 2014-09-05 DIAGNOSIS — R079 Chest pain, unspecified: Secondary | ICD-10-CM | POA: Insufficient documentation

## 2014-09-05 DIAGNOSIS — R0602 Shortness of breath: Secondary | ICD-10-CM | POA: Insufficient documentation

## 2014-09-05 DIAGNOSIS — Z79899 Other long term (current) drug therapy: Secondary | ICD-10-CM | POA: Insufficient documentation

## 2014-09-05 DIAGNOSIS — I10 Essential (primary) hypertension: Secondary | ICD-10-CM | POA: Insufficient documentation

## 2014-09-05 DIAGNOSIS — Z87448 Personal history of other diseases of urinary system: Secondary | ICD-10-CM | POA: Insufficient documentation

## 2014-09-05 DIAGNOSIS — Z7952 Long term (current) use of systemic steroids: Secondary | ICD-10-CM | POA: Insufficient documentation

## 2014-09-05 DIAGNOSIS — R000 Tachycardia, unspecified: Secondary | ICD-10-CM | POA: Insufficient documentation

## 2014-09-05 LAB — CBC WITH DIFFERENTIAL/PLATELET
BASOS PCT: 0 % (ref 0–1)
Basophils Absolute: 0 10*3/uL (ref 0.0–0.1)
Eosinophils Absolute: 0.1 10*3/uL (ref 0.0–0.7)
Eosinophils Relative: 1 % (ref 0–5)
HEMATOCRIT: 31.2 % — AB (ref 36.0–46.0)
HEMOGLOBIN: 10 g/dL — AB (ref 12.0–15.0)
LYMPHS ABS: 1 10*3/uL (ref 0.7–4.0)
LYMPHS PCT: 14 % (ref 12–46)
MCH: 28.4 pg (ref 26.0–34.0)
MCHC: 32.1 g/dL (ref 30.0–36.0)
MCV: 88.6 fL (ref 78.0–100.0)
MONOS PCT: 6 % (ref 3–12)
Monocytes Absolute: 0.4 10*3/uL (ref 0.1–1.0)
NEUTROS PCT: 79 % — AB (ref 43–77)
Neutro Abs: 5.5 10*3/uL (ref 1.7–7.7)
Platelets: 345 10*3/uL (ref 150–400)
RBC: 3.52 MIL/uL — AB (ref 3.87–5.11)
RDW: 14 % (ref 11.5–15.5)
WBC: 6.9 10*3/uL (ref 4.0–10.5)

## 2014-09-05 LAB — BASIC METABOLIC PANEL
Anion gap: 8 (ref 5–15)
BUN: 16 mg/dL (ref 6–20)
CALCIUM: 8.4 mg/dL — AB (ref 8.9–10.3)
CO2: 21 mmol/L — AB (ref 22–32)
Chloride: 110 mmol/L (ref 101–111)
Creatinine, Ser: 0.91 mg/dL (ref 0.44–1.00)
GFR calc Af Amer: >60 mL/min (ref >60)
GFR calc non Af Amer: >60 mL/min (ref >60)
Glucose, Bld: 127 mg/dL — ABNORMAL HIGH (ref 65–99)
Potassium: 4.3 mmol/L (ref 3.5–5.1)
Sodium: 139 mmol/L (ref 135–145)

## 2014-09-05 LAB — I-STAT TROPONIN, ED: Troponin i, poc: 0 ng/mL (ref 0.00–0.08)

## 2014-09-05 LAB — PROTIME-INR
INR: 1.81 — ABNORMAL HIGH (ref 0.00–1.49)
Prothrombin Time: 20.9 seconds — ABNORMAL HIGH (ref 11.6–15.2)

## 2014-09-05 MED ORDER — IOHEXOL 350 MG/ML SOLN
80.0000 mL | Freq: Once | INTRAVENOUS | Status: AC | PRN
  Administered 2014-09-05: 100 mL via INTRAVENOUS

## 2014-09-05 NOTE — ED Notes (Signed)
Pt sts left sided CP worse with inspiration x 4 days

## 2014-09-05 NOTE — ED Provider Notes (Signed)
CSN: [id number]     Arrival date & time 09/05/14  1807 History   First MD Initiated Contact with Patient 09/05/14 1919     Chief Complaint  Patient presents with  . Chest Pain     (Consider location/radiation/quality/duration/timing/severity/associated sxs/prior Treatment) Patient is a 19 y.o. female presenting with chest pain. The history is provided by the patient and a parent.  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 Lupus   . Mitral valve stenosis   . Hypertension   . Renal insufficiency    Past Surgical History  Procedure Laterality Date  . Value replacement    . Laparoscopic appendectomy N/A 02/11/2013    Procedure: APPENDECTOMY LAPAROSCOPIC;  Surgeon: Liz Malady, MD;  Location: [facility] OR;  Service: General;  Laterality: N/A;  . Laparoscopic salpingo oopherectomy Right 02/11/2013    Procedure: LAPAROSCOPIC SALPINGO OOPHORECTOMY;  Surgeon: Lazaro Arms, MD;  Location: [facility] OR;  Service: Gynecology;  Laterality: Right;  . Tonsillectomy    . Appendectomy  January 2015  . Esophagogastroduodenoscopy N/A 02/14/2013    Procedure: ESOPHAGOGASTRODUODENOSCOPY (EGD);  Surgeon: Hart Carwin, MD;  Location: [facility] ENDOSCOPY;  Service: Endoscopy;  Laterality: N/A;  . Cardiac surgery     History reviewed. No pertinent family history. Social History  Substance Use Topics   . Smoking status: Never Smoker   . Smokeless tobacco: Never Used  . Alcohol Use: No   OB History    No data available     Review of Systems  Constitutional: Negative for fever.  Respiratory: Positive for shortness of breath (worse lying flat). Negative for cough.   Cardiovascular: Positive for chest pain. Negative for near-syncope.  Neurological: Negative for weakness.  All other systems reviewed and are negative.     Allergies  Review of patient's allergies indicates no known allergies.  Home Medications   Prior to Admission medications   Medication Sig Start Date End Date Taking? Authorizing Provider  acetaminophen (TYLENOL) 325 MG tablet Take 650 mg by mouth every 6 (six) hours as needed for mild pain.   Yes Historical Provider, MD  hydroxychloroquine (PLAQUENIL) 200 MG tablet Take 400 mg by mouth daily.   Yes Historical Provider, MD  lisinopril (PRINIVIL,ZESTRIL) 10 MG tablet Take 10 mg by mouth. 05/17/14  Yes Historical Provider, MD  mycophenolate (MYFORTIC) 360 MG TBEC EC tablet Take 1,080 mg by mouth 2 (two) times daily.   Yes Historical Provider, MD  predniSONE (DELTASONE) 5 MG tablet Take 5 mg by mouth every other day.    Yes Historical Provider, MD  warfarin (COUMADIN) 7.5 MG tablet Take 7.5 mg by mouth daily.   Yes Historical Provider, MD   BP 132/90 mmHg  Pulse 98  Temp(Src) 98.7 F (37.1 C) (Oral)  Resp 34  SpO2 100%  LMP 07/26/2014 Physical Exam  Constitutional: She is oriented to person, place,  and time. She appears well-developed and well-nourished. No distress.  HENT:  Head: Normocephalic.  Eyes: Conjunctivae are normal.  Neck: Neck supple. No tracheal deviation present.  Cardiovascular: Regular rhythm.  Tachycardia present.  Exam reveals no gallop.   No murmur heard. Prominent opening snap  Pulmonary/Chest: Effort normal. No respiratory distress.  Abdominal: Soft. She exhibits no distension.  Neurological: She is alert and oriented to person,  place, and time.  Skin: Skin is warm and dry.  Psychiatric: She has a normal mood and affect.    ED Course  Procedures (including critical care time) Emergency Focused Ultrasound Exam Limited Ultrasound of the Heart and Pericardium  Performed and interpreted by Dr. Clydene Pugh Indication: chest pain, eval for effusion Multiple views of the heart, pericardium, and IVC are obtained with a multi frequency probe.  Findings: normal contractility, no anechoic fluid, no RV dilitation Interpretation: normal ejection fraction, no pericardial effusion, no right heart strain Images archived electronically.  CPT Code: 40981  Labs Review Labs Reviewed  BASIC METABOLIC PANEL - Abnormal; Notable for the following:    CO2 21 (*)    Glucose, Bld 127 (*)    Calcium 8.4 (*)    All other components within normal limits  PROTIME-INR - Abnormal; Notable for the following:    Prothrombin Time 20.9 (*)    INR 1.81 (*)    All other components within normal limits  CBC WITH DIFFERENTIAL/PLATELET - Abnormal; Notable for the following:    RBC 3.52 (*)    Hemoglobin 10.0 (*)    HCT 31.2 (*)    Neutrophils Relative % 79 (*)    All other components within normal limits  I-STAT TROPOININ, ED    Imaging Review Dg Chest 2 View  09/05/2014   CLINICAL DATA:  Increasing chest pain x 3-4 days. No n/v. No cough or congestion. No fever. No headache or dizziness. Some sob. No htn or diab. Non smoker. Hx cardiac valve replacement in 2012.  EXAM: CHEST  2 VIEW  COMPARISON:  07/22/2014  FINDINGS: Cardiac silhouette is mildly enlarged. There stable changes from previous mitral valve replacement. Normal mediastinal and hilar contours.  Clear lungs.  No pleural effusion or pneumothorax.  Skeletal structures are unremarkable.  IMPRESSION: No acute cardiopulmonary disease.   Electronically Signed   By: Amie Portland M.D.   On: 09/05/2014 18:55   Ct Angio Chest Pe W/cm &/or Wo Cm  09/05/2014   CLINICAL DATA:  Four day history  of shortness of breath and chest pain  EXAM: CT ANGIOGRAPHY CHEST WITH CONTRAST  TECHNIQUE: Multidetector CT imaging of the chest was performed using the standard protocol during bolus administration of intravenous contrast. Multiplanar CT image reconstructions and MIPs were obtained to evaluate the vascular anatomy.  CONTRAST:  OMNIPAQUE IOHEXOL 350 MG/ML SOLN  COMPARISON:  July 23, 2014 CT angiogram chest and chest radiograph Jun 05, 2014  FINDINGS: There is no demonstrable pulmonary embolus. There is no thoracic aortic aneurysm or dissection. The visualized great vessels appear unremarkable.  There is focal atelectasis in the posterior left base. Lungs elsewhere are clear.  Visualized thyroid appears normal. There is no appreciable thoracic adenopathy. There are multiple small axillary lymph nodes which do not meet size criteria for pathologic significance, a stable finding compared to prior study. There is a mitral valve replacement. There is left ventricular hypertrophy. The pericardium is not appreciably thickened. Visualized upper abdominal structures appear normal.  There are no blastic or lytic bone lesions.  Review  of the MIP images confirms the above findings.  IMPRESSION: No demonstrable pulmonary embolus. Focal atelectasis posterior right base. Status post mitral valve replacement. No adenopathy. Left ventricular hypertrophy.   Electronically Signed   By: Bretta Bang III M.D.   On: 09/05/2014 23:37   I, Lyndal Pulley, personally reviewed and evaluated these images and lab results as part of my medical decision-making.   EKG Interpretation   Date/Time:  Thursday September 05 2014 18:13:26 EDT Ventricular Rate:  111 PR Interval:  172 QRS Duration: 74 QT Interval:  338 QTC Calculation: 459 R Axis:   73 Text Interpretation:  Sinus tachycardia Nonspecific ST and T wave  abnormality Abnormal ECG Compared to previous tracing T wave abnormality  Confirmed by Modestine Scherzinger MD, Reuel Boom ([id number]) on  09/05/2014 9:48:56 PM      MDM   Final diagnoses:  Chest pain at rest     19 year old female with history of lupus status post remote mitral valve replacement anticoagulant on Coumadin presents with chest pain in her left upper chest that is worse with breathing and lying flat which makes her uncomfortable. Pain is atypical for ACS, troponin negative, non-specific changes on EKG are likely not related to acute ischemic event. She is tachycardic and I considered pulmonary embolism given that her Coumadin is currently subtherapeutic but there is no PE on CT scan. Bedside ultrasound of the heart showed no effusion or other signs of acute pericarditis nor were any typical findings noted on EKG. Pain does not appear reproducible but may be related to recently moving multiple boxes and lifting things with her family over the last week during a move. I suggested that she follow closely with her primary care provider and recheck her INR at a later date. Plan to follow up with PCP as needed and return precautions discussed for worsening or new concerning symptoms.     Lyndal Pulley, MD 09/06/14 [phone]

## 2014-09-05 NOTE — ED Notes (Signed)
Patient C/O pain in her left upper chest that is worse when she breaths in, Burps or lays down.  States that it has been ongoing since Monday. States that Ibuprofen helped the pain.  Area is non tender to palpation.

## 2014-09-05 NOTE — Discharge Instructions (Signed)

## 2014-12-10 ENCOUNTER — Encounter (HOSPITAL_COMMUNITY): Payer: Self-pay | Admitting: Emergency Medicine

## 2014-12-10 ENCOUNTER — Emergency Department (HOSPITAL_COMMUNITY)
Admission: EM | Admit: 2014-12-10 | Discharge: 2014-12-10 | Disposition: A | Discharge status: Home / Self Care | Payer: Medicaid Other | Attending: Emergency Medicine | Admitting: Emergency Medicine

## 2014-12-10 ENCOUNTER — Emergency Department (HOSPITAL_COMMUNITY): Payer: Medicaid Other

## 2014-12-10 DIAGNOSIS — I10 Essential (primary) hypertension: Secondary | ICD-10-CM | POA: Insufficient documentation

## 2014-12-10 DIAGNOSIS — R220 Localized swelling, mass and lump, head: Secondary | ICD-10-CM | POA: Diagnosis present

## 2014-12-10 DIAGNOSIS — M329 Systemic lupus erythematosus, unspecified: Secondary | ICD-10-CM | POA: Diagnosis not present

## 2014-12-10 DIAGNOSIS — Z79899 Other long term (current) drug therapy: Secondary | ICD-10-CM | POA: Insufficient documentation

## 2014-12-10 DIAGNOSIS — R509 Fever, unspecified: Secondary | ICD-10-CM | POA: Diagnosis not present

## 2014-12-10 DIAGNOSIS — R791 Abnormal coagulation profile: Secondary | ICD-10-CM | POA: Diagnosis not present

## 2014-12-10 DIAGNOSIS — Z87448 Personal history of other diseases of urinary system: Secondary | ICD-10-CM | POA: Insufficient documentation

## 2014-12-10 DIAGNOSIS — D649 Anemia, unspecified: Secondary | ICD-10-CM | POA: Diagnosis not present

## 2014-12-10 DIAGNOSIS — K1121 Acute sialoadenitis: Secondary | ICD-10-CM | POA: Insufficient documentation

## 2014-12-10 DIAGNOSIS — Z7901 Long term (current) use of anticoagulants: Secondary | ICD-10-CM | POA: Diagnosis not present

## 2014-12-10 LAB — CBC WITH DIFFERENTIAL/PLATELET
BASOS ABS: 0 10*3/uL (ref 0.0–0.1)
Basophils Relative: 0 %
EOS PCT: 1 %
Eosinophils Absolute: 0.1 10*3/uL (ref 0.0–0.7)
HCT: 26.8 % — ABNORMAL LOW (ref 36.0–46.0)
Hemoglobin: 8.1 g/dL — ABNORMAL LOW (ref 12.0–15.0)
Lymphocytes Relative: 20 %
Lymphs Abs: 1.6 10*3/uL (ref 0.7–4.0)
MCH: 26.6 pg (ref 26.0–34.0)
MCHC: 30.2 g/dL (ref 30.0–36.0)
MCV: 88.2 fL (ref 78.0–100.0)
Monocytes Absolute: 0.7 10*3/uL (ref 0.1–1.0)
Monocytes Relative: 9 %
NEUTROS ABS: 5.7 10*3/uL (ref 1.7–7.7)
NEUTROS PCT: 70 %
PLATELETS: 234 10*3/uL (ref 150–400)
RBC: 3.04 MIL/uL — ABNORMAL LOW (ref 3.87–5.11)
RDW: 15.2 % (ref 11.5–15.5)
WBC: 8.1 10*3/uL (ref 4.0–10.5)

## 2014-12-10 LAB — URINALYSIS, ROUTINE W REFLEX MICROSCOPIC
Bilirubin Urine: NEGATIVE
GLUCOSE, UA: NEGATIVE mg/dL
KETONES UR: NEGATIVE mg/dL
Leukocytes, UA: NEGATIVE
Nitrite: NEGATIVE
Protein, ur: >300 mg/dL — AB
Specific Gravity, Urine: 1.02 (ref 1.005–1.030)
pH: 6 (ref 5.0–8.0)

## 2014-12-10 LAB — COMPREHENSIVE METABOLIC PANEL
ALK PHOS: 60 U/L (ref 38–126)
ALT: 12 U/L — ABNORMAL LOW (ref 14–54)
ANION GAP: 11 (ref 5–15)
AST: 16 U/L (ref 15–41)
Albumin: 2.5 g/dL — ABNORMAL LOW (ref 3.5–5.0)
BUN: 12 mg/dL (ref 6–20)
CHLORIDE: 110 mmol/L (ref 101–111)
CO2: 22 mmol/L (ref 22–32)
CREATININE: 1.11 mg/dL — AB (ref 0.44–1.00)
Calcium: 7.9 mg/dL — ABNORMAL LOW (ref 8.9–10.3)
GFR calc non Af Amer: >60 mL/min (ref >60)
Glucose, Bld: 83 mg/dL (ref 65–99)
Potassium: 3.4 mmol/L — ABNORMAL LOW (ref 3.5–5.1)
Sodium: 143 mmol/L (ref 135–145)
Total Bilirubin: 0.4 mg/dL (ref 0.3–1.2)
Total Protein: 6.1 g/dL — ABNORMAL LOW (ref 6.5–8.1)

## 2014-12-10 LAB — URINE MICROSCOPIC-ADD ON

## 2014-12-10 LAB — I-STAT CG4 LACTIC ACID, ED
LACTIC ACID, VENOUS: 0.55 mmol/L (ref 0.5–2.0)
LACTIC ACID, VENOUS: 0.49 mmol/L — AB (ref 0.5–2.0)

## 2014-12-10 LAB — PROTIME-INR
INR: 1.23 (ref 0.00–1.49)
Prothrombin Time: 15.7 seconds — ABNORMAL HIGH (ref 11.6–15.2)

## 2014-12-10 LAB — I-STAT BETA HCG BLOOD, ED (MC, WL, AP ONLY): I-stat hCG, quantitative: <5 m[IU]/mL (ref <5)

## 2014-12-10 MED ORDER — VANCOMYCIN HCL IN DEXTROSE 1-5 GM/200ML-% IV SOLN
1000.0000 mg | Freq: Once | INTRAVENOUS | Status: AC
  Administered 2014-12-10: 1000 mg via INTRAVENOUS
  Filled 2014-12-10: qty 200

## 2014-12-10 MED ORDER — IOHEXOL 300 MG/ML  SOLN
75.0000 mL | Freq: Once | INTRAMUSCULAR | Status: AC | PRN
  Administered 2014-12-10: 75 mL via INTRAVENOUS

## 2014-12-10 MED ORDER — SODIUM CHLORIDE 0.9 % IV SOLN
1000.0000 mL | INTRAVENOUS | Status: DC
  Administered 2014-12-10: 1000 mL via INTRAVENOUS

## 2014-12-10 MED ORDER — ACETAMINOPHEN 500 MG PO TABS
1000.0000 mg | ORAL_TABLET | Freq: Once | ORAL | Status: AC
  Administered 2014-12-10: 1000 mg via ORAL
  Filled 2014-12-10: qty 2

## 2014-12-10 MED ORDER — CIPROFLOXACIN HCL 750 MG PO TABS: 750.0000 mg | ORAL_TABLET | Freq: Two times a day (BID) | ORAL | Status: DC

## 2014-12-10 MED ORDER — CLINDAMYCIN HCL 150 MG PO CAPS: 450.0000 mg | ORAL_CAPSULE | Freq: Three times a day (TID) | ORAL | Status: DC

## 2014-12-10 MED ORDER — MORPHINE SULFATE (PF) 4 MG/ML IV SOLN
4.0000 mg | Freq: Once | INTRAVENOUS | Status: AC
  Administered 2014-12-10: 4 mg via INTRAVENOUS
  Filled 2014-12-10: qty 1

## 2014-12-10 MED ORDER — SODIUM CHLORIDE 0.9 % IV BOLUS (SEPSIS)
1000.0000 mL | INTRAVENOUS | Status: AC
  Administered 2014-12-10 (×2): 1000 mL via INTRAVENOUS

## 2014-12-10 MED ORDER — PIPERACILLIN-TAZOBACTAM 3.375 G IVPB 30 MIN
3.3750 g | Freq: Once | INTRAVENOUS | Status: AC
  Administered 2014-12-10: 3.375 g via INTRAVENOUS
  Filled 2014-12-10: qty 50

## 2014-12-10 NOTE — Discharge Instructions (Signed)
Your pain is from an infection in your parotid gland. You will need to take antibiotics as directed until completed. Your INR is low today but since you're being started on antibiotics it will likely go up. Call your regular doctor tomorrow to discuss any adjustments and further monitoring. Use tylenol and motrin as needed for pain or fever. Stay well hydrated. Follow up with your regular doctor in 3 days for recheck of ongoing symptoms. Return to the ER for changes or worsening symptoms.   Parotitis  Parotitis means one or both of your parotid glands are sore and puffy (inflamed). The parotid glands make spit (saliva) in the mouth. HOME CARE  If you were given antibiotic medicines, take them as told. Finish them even if you start to feel better.  Put warm cloths (compresses) on the sore area.  Only take medicines as told by your doctor.  Drink enough fluids to keep your pee (urine) clear or pale yellow. GET HELP RIGHT AWAY IF:  You have more pain or puffiness (swelling) that is not helped by medicine.  You have a fever. MAKE SURE YOU:  Understand these instructions.  Will watch your condition.  Will get help right away if you are not doing well or get worse.   This information is not intended to replace advice given to you by your health care provider. Make sure you discuss any questions you have with your health care provider.   Document Released: 02/13/2010 Document Revised: 04/05/2011 Document Reviewed: 06/06/2014 Elsevier Interactive Patient Education [employer]

## 2014-12-10 NOTE — ED Provider Notes (Signed)
CSN: [id number]     Arrival date & time 12/10/14  1730 History   First MD Initiated Contact with Patient 12/10/14 1819     Chief Complaint  Patient presents with  . Facial Swelling     (Consider location/radiation/quality/duration/timing/severity/associated sxs/prior Treatment) HPI Comments: Joan Miles is a 19 y.o. female with a PMHx of Lupus, mitral valve stenosis s/p replacement on chronic coumadin, HTN, and renal insufficiency, with a PSHx of appendectomy, R salpingo-oophorectomy, and tonsillectomy, who presents to the ED with complaints of right facial swelling and pain that began last night. She reports the pain is 9/10 constant aching of the right face, nonradiating, worse with chewing or laying down the right side, with no treatments tried prior to arrival. Upon arrival she was noted to have a fever of 101.2, patient states that she did not know she had a fever. She said associated chills. She denies any dental pain, gum swelling or drainage, chest pain, shortness of breath, cough, rhinorrhea, sore throat, ear pain or drainage, abdominal pain, nausea, vomiting, diarrhea, constipation, dysuria, hematuria, numbness, tingling, weakness, recent travel, or sick contacts. She states she is up-to-date on all vaccinations.  Patient is a 19 y.o. female presenting with general illness. The history is provided by the patient and medical records. No language interpreter was used.  Illness Location:  R facial swelling Quality:  Aching Severity:  Severe Onset quality:  Gradual Duration:  1 day Timing:  Constant Progression:  Unchanged Chronicity:  New Context:  Nothing Relieved by:  Nothing tried Worsened by:  Chewing or laying down on R face Ineffective treatments:  Nothing tried Associated symptoms: fever (101.2)   Associated symptoms: no abdominal pain, no chest pain, no cough, no diarrhea, no ear pain, no myalgias, no nausea, no rhinorrhea, no shortness of breath, no sore throat and no  vomiting   Risk factors:  On immunosuppressant medications   Past Medical History  Diagnosis Date  . Lupus (HCC)   . Mitral valve stenosis   . Hypertension   . Renal insufficiency    Past Surgical History  Procedure Laterality Date  . Value replacement    . Laparoscopic appendectomy N/A 02/11/2013    Procedure: APPENDECTOMY LAPAROSCOPIC;  Surgeon: Liz Malady, MD;  Location: [facility] OR;  Service: General;  Laterality: N/A;  . Laparoscopic salpingo oopherectomy Right 02/11/2013    Procedure: LAPAROSCOPIC SALPINGO OOPHORECTOMY;  Surgeon: Lazaro Arms, MD;  Location: [facility] OR;  Service: Gynecology;  Laterality: Right;  . Tonsillectomy    . Appendectomy  January 2015  . Esophagogastroduodenoscopy N/A 02/14/2013    Procedure: ESOPHAGOGASTRODUODENOSCOPY (EGD);  Surgeon: Hart Carwin, MD;  Location: [facility] ENDOSCOPY;  Service: Endoscopy;  Laterality: N/A;  . Cardiac surgery     History reviewed. No pertinent family history. Social History  Substance Use Topics  . Smoking status: Never Smoker   . Smokeless tobacco: Never Used  . Alcohol Use: No   OB History    No data available     Review of Systems  Constitutional: Positive for fever (101.2) and chills.  HENT: Positive for facial swelling. Negative for dental problem, ear discharge, ear pain, rhinorrhea, sore throat and trouble swallowing.   Eyes: Negative for pain and discharge.  Respiratory: Negative for cough and shortness of breath.   Cardiovascular: Negative for chest pain.  Gastrointestinal: Negative for nausea, vomiting, abdominal pain, diarrhea and constipation.  Genitourinary: Negative for dysuria and hematuria.  Musculoskeletal: Negative for myalgias and arthralgias.  Skin: Negative for color  change.  Allergic/Immunologic: Positive for immunocompromised state (on chronic prednisone, mycophenolate, and plaquenil).  Neurological: Negative for weakness and numbness.  Psychiatric/Behavioral: Negative for confusion.   10  Systems reviewed and are negative for acute change except as noted in the HPI.    Allergies  Review of patient's allergies indicates no known allergies.  Home Medications   Prior to Admission medications   Medication Sig Start Date End Date Taking? Authorizing Provider  acetaminophen (TYLENOL) 325 MG tablet Take 650 mg by mouth every 6 (six) hours as needed for mild pain.    Historical Provider, MD  hydroxychloroquine (PLAQUENIL) 200 MG tablet Take 400 mg by mouth daily.    Historical Provider, MD  lisinopril (PRINIVIL,ZESTRIL) 10 MG tablet Take 10 mg by mouth. 05/17/14   Historical Provider, MD  mycophenolate (MYFORTIC) 360 MG TBEC EC tablet Take 1,080 mg by mouth 2 (two) times daily.    Historical Provider, MD  predniSONE (DELTASONE) 5 MG tablet Take 5 mg by mouth every other day.     Historical Provider, MD  warfarin (COUMADIN) 7.5 MG tablet Take 7.5 mg by mouth daily.    Historical Provider, MD   BP 170/100 mmHg  Pulse 125  Temp(Src) 101.2 F (38.4 C) (Oral)  Resp 18  SpO2 98% Physical Exam  Constitutional: She is oriented to person, place, and time. She appears well-developed and well-nourished.  Non-toxic appearance. No distress.  Febrile at 101.2, nontoxic, NAD, tachycardic. HTN noted, but improved upon recheck, recheck at 130s/102  HENT:  Head: Normocephalic and atraumatic.  Right Ear: Hearing, tympanic membrane, external ear and ear canal normal.  Left Ear: Hearing, tympanic membrane, external ear and ear canal normal.  Nose: Nose normal.  Mouth/Throat: Uvula is midline, oropharynx is clear and moist and mucous membranes are normal. No trismus in the jaw. No dental abscesses, uvula swelling or dental caries.  Ears are clear bilaterally, some cerumen in R ear canal. Nose clear. Oropharynx clear and moist, without uvular swelling or deviation, no trismus or drooling, no tonsillar swelling or erythema, no exudates. Stensen's duct clear Mild R facial swelling most notable  anterior to the ear, with mild TTP over parotid gland, no overlying skin changes  Eyes: Conjunctivae and EOM are normal. Right eye exhibits no discharge. Left eye exhibits no discharge.  Neck: Normal range of motion. Neck supple.  No meningismus  Cardiovascular: Regular rhythm, normal heart sounds and intact distal pulses.  Tachycardia present.  Exam reveals no gallop and no friction rub.   No murmur heard. Tachycardic, reg rhythm, nl s1/s2, no m/r/g, distal pulses intact, no pedal edema   Pulmonary/Chest: Effort normal and breath sounds normal. No respiratory distress. She has no decreased breath sounds. She has no wheezes. She has no rhonchi. She has no rales.  CTAB in all lung fields, no w/r/r, no hypoxia or increased WOB, speaking in full sentences, SpO2 98% on RA   Abdominal: Soft. Normal appearance and bowel sounds are normal. She exhibits no distension. There is no tenderness. There is no rigidity, no rebound, no guarding, no CVA tenderness, no tenderness at McBurney's point and negative Murphy's sign.  Musculoskeletal: Normal range of motion.  MAE x4 Strength and sensation grossly intact Distal pulses intact Gait steady  Lymphadenopathy:       Head (right side): Submandibular adenopathy present.       Head (left side): Submandibular adenopathy present.    She has cervical adenopathy.  Shotty b/l cervical and submandibular LAD which is mildly TTP  Neurological: She is alert and oriented to person, place, and time. She has normal strength. No sensory deficit.  Skin: Skin is warm, dry and intact. No rash noted.  Psychiatric: She has a normal mood and affect.  Nursing note and vitals reviewed.   ED Course  Procedures (including critical care time) Labs Review Labs Reviewed  COMPREHENSIVE METABOLIC PANEL - Abnormal; Notable for the following:    Potassium 3.4 (*)    Creatinine, Ser 1.11 (*)    Calcium 7.9 (*)    Total Protein 6.1 (*)    Albumin 2.5 (*)    ALT 12 (*)    All  other components within normal limits  CBC WITH DIFFERENTIAL/PLATELET - Abnormal; Notable for the following:    RBC 3.04 (*)    Hemoglobin 8.1 (*)    HCT 26.8 (*)    All other components within normal limits  URINALYSIS, ROUTINE W REFLEX MICROSCOPIC (NOT AT [facility]) - Abnormal; Notable for the following:    APPearance CLOUDY (*)    Hgb urine dipstick TRACE (*)    Protein, ur >300 (*)    All other components within normal limits  PROTIME-INR - Abnormal; Notable for the following:    Prothrombin Time 15.7 (*)    All other components within normal limits  URINE MICROSCOPIC-ADD ON - Abnormal; Notable for the following:    Squamous Epithelial / LPF 6-30 (*)    Bacteria, UA FEW (*)    Casts GRANULAR CAST (*)    All other components within normal limits  I-STAT CG4 LACTIC ACID, ED - Abnormal; Notable for the following:    Lactic Acid, Venous 0.49 (*)    All other components within normal limits  CULTURE, BLOOD (ROUTINE X 2)  CULTURE, BLOOD (ROUTINE X 2)  URINE CULTURE  BASIC METABOLIC PANEL  I-STAT CG4 LACTIC ACID, ED  I-STAT BETA HCG BLOOD, ED ([facility], [facility], [facility] ONLY)    Imaging Review Ct Maxillofacial W/cm  12/10/2014  CLINICAL DATA:  Swelling under right mandible since last night with pain. EXAM: CT MAXILLOFACIAL WITH CONTRAST TECHNIQUE: Multidetector CT imaging of the maxillofacial structures was performed with intravenous contrast. Multiplanar CT image reconstructions were also generated. A small metallic BB was placed on the right temple in order to reliably differentiate right from left. CONTRAST:  4mL OMNIPAQUE IOHEXOL 300 MG/ML  SOLN COMPARISON:  Neck CT 04/22/2013 FINDINGS: The visualized intracranial structures are within normal. Orbits are normal and symmetric paranasal sinuses are clear. Mastoid air cells are clear. There is inflammatory change involving the right parotid gland which is larger than the left with adjacent subcutaneous edema/ inflammation. Two reactive lymph nodes along  the anterior superior aspect of the superficial segment of the right parotid gland. Adjacent vascular structures are within normal. Submandibular and sublingual glands are normal symmetric. No calcifications over the salivary glands. No focal fluid collection to suggest abscess. No evidence of dental caries or periodontal disease. Remaining spaces of the suprahyoid neck are within normal. Pharynx and larynx are normal. Epiglottis is normal. Adenopathy over the neck base right worse than left with largest node measuring 1.6 cm by short axis. Remainder the exam is within normal. IMPRESSION: Acute inflammatory process involving the right parotid gland likely acute sialoadenitis. This can be of infectious/ viral or inflammatory origin. This is likely related to an inflammatory origin associated with patient's known history of systemic lupus erythematosus. Mild adenopathy over the neck base likely related to patient's lupus. Electronically Signed   By: Reuel Boom  Micheline Maze M.D.   On: 12/10/2014 21:26   I have personally reviewed and evaluated these images and lab results as part of my medical decision-making.   EKG Interpretation   Date/Time:  Tuesday December 10 2014 20:04:14 EST Ventricular Rate:  97 PR Interval:  153 QRS Duration: 80 QT Interval:  354 QTC Calculation: 450 R Axis:   74 Text Interpretation:  Sinus rhythm Confirmed by Adriana Simas  MD, BRIAN ([id number]) on  12/10/2014 9:01:35 PM      MDM   Final diagnoses:  Acute parotitis  Fever, unspecified fever cause  Right facial swelling  Anemia, unspecified anemia type  Subtherapeutic international normalized ratio (INR)    19 y.o. female here with R facial swelling and pain by the parotid gland. Some chills at home, fever noted here up to 101.2, tachycardic in the 120s. Concerning for sepsis, although pt well appearing, but she is immunosuppressed due to medications. On exam, R cheek somewhat swollen appearing, no skin changes, dentitia without abscess  or decay, some mild LAD bilaterally, clear lung exam, ears clear. Could be parotitis. Code sepsis called, fluids ordered, labs ordered. Empiric vanc/zosyn started. Tylenol given. Will give pain control as well. Will reassess shortly.   6:59 PM Pt seen by Dr. Adriana Simas, my attending, who would like to also obtain imaging of this area, will order CT maxillofacial with contrast. Will reassess shortly.   10:31 PM Lactic acid WNL x2. U/A contaminated with few bacteria, doubt UTI. CMP with mildly low K at 3.4, mildly elevated Cr at 1.11 similar to prior. CBC w/diff with no leukocytosis and with mild anemia slightly worsened from prior but pt asymptomatic at this time, H/H 8.1/26.8. BetaHCG neg. INR 1.23, subtherapeutic. CT showing inflammation of R parotid gland and associated LAD. EKG unremarkable. Will recheck VS, tachycardia improved after 2L bolus. Will see if fever has improved. If VS improved, likely could be discharged home with abx and close f/up.   10:56 PM Recheck of temp reveals improvement of fever. Given clinical picture, pt appears well and not septic, no lactic acid elevations and no leukocytosis, doubt need for admission. VS improved with fluids and tylenol. Discussed tylenol/motrin for pain or fever, will start on clinda/cipro for parotitis. Subtherapeutic INR discussed , given that she'll be started on abx this will probably rise, but discussed calling her PCP tomorrow to discuss if anything else needs to be done with this in the mean time. Close PCP f/up discussed, f/up in 3 days. Strict return precautions advised. I explained the diagnosis and have given explicit precautions to return to the ER including for any other new or worsening symptoms. The patient understands and accepts the medical plan as it's been dictated and I have answered their questions. Discharge instructions concerning home care and prescriptions have been given. The patient is STABLE and is discharged to home in good  condition.  BP 111/68 mmHg  Pulse 97  Temp(Src) 98.6 F (37 C) (Oral)  Resp 12  Ht 5\' 2"  (1.575 m)  Wt 185 lb (83.915 kg)  BMI 33.83 kg/m2  SpO2 96%  Meds ordered this encounter  Medications  . acetaminophen (TYLENOL) tablet 1,000 mg    Sig:   . 0.9 %  sodium chloride infusion    Sig:   . sodium chloride 0.9 % bolus 1,000 mL    Sig:     Order Specific Question:  Enter Patient Weight in Kilograms    Answer:  84  . piperacillin-tazobactam (ZOSYN) IVPB 3.375 g  Sig:     Order Specific Question:  Antibiotic Indication:    Answer:  Sepsis  . vancomycin (VANCOCIN) IVPB 1000 mg/200 mL premix    Sig:     Order Specific Question:  Indication:    Answer:  Sepsis  . morphine 4 MG/ML injection 4 mg    Sig:   . iohexol (OMNIPAQUE) 300 MG/ML solution 75 mL    Sig:   . clindamycin (CLEOCIN) 150 MG capsule    Sig: Take 3 capsules (450 mg total) by mouth 3 (three) times daily. X 10 days    Dispense:  90 capsule    Refill:  0    Order Specific Question:  Supervising Provider    Answer:  MILLER, BRIAN [[id number]]  . ciprofloxacin (CIPRO) 750 MG tablet    Sig: Take 1 tablet (750 mg total) by mouth 2 (two) times daily. One po bid x 10 days    Dispense:  20 tablet    Refill:  0    Order Specific Question:  Supervising Provider    Answer:  Eber Hong [[id number]]     Aren Pryde Camprubi-Soms, PA-C 12/10/14 2258  Donnetta Hutching, MD 12/13/14 [phone]

## 2014-12-10 NOTE — Progress Notes (Signed)
ANTIBIOTIC CONSULT NOTE - INITIAL  Pharmacy Consult for Vancomycin and Zosyn Indication: rule out sepsis  No Known Allergies  Patient Measurements:   Adjusted Body Weight:   Vital Signs: Temp: 101.2 F (38.4 C) (11/15 1733) Temp Source: Oral (11/15 1733) BP: 170/100 mmHg (11/15 1733) Pulse Rate: 125 (11/15 1733) Intake/Output from previous day:   Intake/Output from this shift:    Labs: No results for input(s): WBC, HGB, PLT, LABCREA, CREATININE in the last 72 hours. CrCl cannot be calculated (Unknown ideal weight.). No results for input(s): VANCOTROUGH, VANCOPEAK, VANCORANDOM, GENTTROUGH, GENTPEAK, GENTRANDOM, TOBRATROUGH, TOBRAPEAK, TOBRARND, AMIKACINPEAK, AMIKACINTROU, AMIKACIN in the last 72 hours.   Microbiology: No results found for this or any previous visit (from the past 720 hour(s)).  Medical History: Past Medical History  Diagnosis Date  . Lupus (HCC)   . Mitral valve stenosis   . Hypertension   . Renal insufficiency     Medications:   (Not in a hospital admission) Scheduled:  .  morphine injection  4 mg Intravenous Once   Infusions:  . sodium chloride    . piperacillin-tazobactam    . sodium chloride    . vancomycin     Assessment: 19yo female with history of lupus, mitral valve stenosis s/p replacement on warfarin, HTN and renal insufficiency presents with R facial swelling. Pharmacy is consulted to dose vancomycin and zosyn for suspected sepsis. Tmax 101.2, LA wnl, WBC 8.1, sCr 1.11.  Goal of Therapy:  Vancomycin trough level 15-20 mcg/ml  Plan:  Vancomycin 1g IV q8h Zosyn 3.375g IV q8h Measure antibiotic drug levels at steady state Follow up culture results, renal function and clinical course  Arlean Hopping. Newman Pies, PharmD Clinical Pharmacist Pager [phone] 12/10/2014,6:36 PM

## 2014-12-10 NOTE — ED Notes (Signed)
Pt stable, ambulatory, states understanding of discharge instructions 

## 2014-12-10 NOTE — ED Notes (Signed)
Pt sts right sided facial swelling starting last night; pt sts pain and hx of lupus

## 2014-12-11 LAB — URINE CULTURE: Culture: NO GROWTH

## 2014-12-15 LAB — CULTURE, BLOOD (ROUTINE X 2)
CULTURE: NO GROWTH
CULTURE: NO GROWTH

## 2014-12-22 ENCOUNTER — Emergency Department (HOSPITAL_COMMUNITY)
Admission: EM | Admit: 2014-12-22 | Discharge: 2014-12-22 | Disposition: A | Discharge status: Home / Self Care | Payer: Medicaid Other | Attending: Emergency Medicine | Admitting: Emergency Medicine

## 2014-12-22 ENCOUNTER — Encounter (HOSPITAL_COMMUNITY): Payer: Self-pay | Admitting: Nurse Practitioner

## 2014-12-22 DIAGNOSIS — Y998 Other external cause status: Secondary | ICD-10-CM | POA: Insufficient documentation

## 2014-12-22 DIAGNOSIS — Y9289 Other specified places as the place of occurrence of the external cause: Secondary | ICD-10-CM | POA: Diagnosis not present

## 2014-12-22 DIAGNOSIS — Z79899 Other long term (current) drug therapy: Secondary | ICD-10-CM | POA: Diagnosis not present

## 2014-12-22 DIAGNOSIS — T7840XA Allergy, unspecified, initial encounter: Secondary | ICD-10-CM | POA: Diagnosis present

## 2014-12-22 DIAGNOSIS — Y9389 Activity, other specified: Secondary | ICD-10-CM | POA: Insufficient documentation

## 2014-12-22 DIAGNOSIS — R21 Rash and other nonspecific skin eruption: Secondary | ICD-10-CM | POA: Diagnosis not present

## 2014-12-22 DIAGNOSIS — I10 Essential (primary) hypertension: Secondary | ICD-10-CM | POA: Insufficient documentation

## 2014-12-22 DIAGNOSIS — R011 Cardiac murmur, unspecified: Secondary | ICD-10-CM | POA: Insufficient documentation

## 2014-12-22 DIAGNOSIS — X58XXXA Exposure to other specified factors, initial encounter: Secondary | ICD-10-CM | POA: Diagnosis not present

## 2014-12-22 DIAGNOSIS — Z792 Long term (current) use of antibiotics: Secondary | ICD-10-CM | POA: Insufficient documentation

## 2014-12-22 MED ORDER — DIPHENHYDRAMINE HCL 25 MG PO CAPS
25.0000 mg | ORAL_CAPSULE | Freq: Once | ORAL | Status: AC
  Administered 2014-12-22: 25 mg via ORAL
  Filled 2014-12-22: qty 1

## 2014-12-22 NOTE — Discharge Instructions (Signed)
Please discontinue using current antibiotic therapy. Please continue using Benadryl, Claritin, Zantac as needed for itching and hives. If any new or worsening signs or symptoms present including difficulty breathing, swallowing, swelling, or worsening eyes please return to the emergency room immediately for further evaluation and management. Please contact her primary care provider first thing tomorrow morning and schedule follow-up evaluation tomorrow for reevaluation and management of your ongoing chronic conditions.

## 2014-12-22 NOTE — ED Provider Notes (Signed)
CSN: [id number]     Arrival date & time 12/22/14  1336 History   First MD Initiated Contact with Patient 12/22/14 1541     Chief Complaint  Patient presents with  . Allergic Reaction   HPI   19 year old female presents today with hives. Patient reports shortly prior to arrival approximately 30 minutes after taking her clindamycin and Cipro antibiotic she started developing diffuse hives throughout her entire body. Patient reports that she was seen on 12/10/2014 (12 days ago) and diagnosed with peritonitis. She was placed on clindamycin and Cipro for 14 days. He is taking antibiotics as directed in addition to her regularly prescribed medications. She denies any new additions to her medical regimen other than the antibiotics, no changes in daily skin care products, exposure to abnormal food or drink, or any abnormal exposures in general. She reports she was at work when symptoms started. Patient denies any swelling of the mouth or tongue or throat, difficulty breathing, chest pain, abdominal pain, or any other concerning signs or symptoms other than the rash. She reports that shortly after starting antibiotics for peritonitis symptoms resolved, she reports no fever, chills, nausea, vomiting, pain to the face and jaw or neck. Patient reports she did not follow-up with her primary care provider after being discharged home from the hospital. No history of the same.  Past Medical History  Diagnosis Date  . Lupus (HCC)   . Mitral valve stenosis   . Hypertension   . Renal insufficiency    Past Surgical History  Procedure Laterality Date  . Value replacement    . Laparoscopic appendectomy N/A 02/11/2013    Procedure: APPENDECTOMY LAPAROSCOPIC;  Surgeon: Liz Malady, MD;  Location: [facility] OR;  Service: General;  Laterality: N/A;  . Laparoscopic salpingo oopherectomy Right 02/11/2013    Procedure: LAPAROSCOPIC SALPINGO OOPHORECTOMY;  Surgeon: Lazaro Arms, MD;  Location: [facility] OR;  Service: Gynecology;   Laterality: Right;  . Tonsillectomy    . Appendectomy  January 2015  . Esophagogastroduodenoscopy N/A 02/14/2013    Procedure: ESOPHAGOGASTRODUODENOSCOPY (EGD);  Surgeon: Hart Carwin, MD;  Location: [facility] ENDOSCOPY;  Service: Endoscopy;  Laterality: N/A;  . Cardiac surgery     History reviewed. No pertinent family history. Social History  Substance Use Topics  . Smoking status: Never Smoker   . Smokeless tobacco: Never Used  . Alcohol Use: No   OB History    No data available     Review of Systems  All other systems reviewed and are negative.   Allergies  Review of patient's allergies indicates no known allergies.  Home Medications   Prior to Admission medications   Medication Sig Start Date End Date Taking? Authorizing Provider  acetaminophen (TYLENOL) 325 MG tablet Take 650 mg by mouth every 6 (six) hours as needed for mild pain.    Historical Provider, MD  ciprofloxacin (CIPRO) 750 MG tablet Take 1 tablet (750 mg total) by mouth 2 (two) times daily. One po bid x 10 days 12/10/14   Mercedes Camprubi-Soms, PA-C  clindamycin (CLEOCIN) 150 MG capsule Take 3 capsules (450 mg total) by mouth 3 (three) times daily. X 10 days 12/10/14   Mercedes Camprubi-Soms, PA-C  hydroxychloroquine (PLAQUENIL) 200 MG tablet Take 400 mg by mouth daily.    Historical Provider, MD  lisinopril (PRINIVIL,ZESTRIL) 10 MG tablet Take 10 mg by mouth. 05/17/14   Historical Provider, MD  mycophenolate (MYFORTIC) 360 MG TBEC EC tablet Take 1,080 mg by mouth 2 (two) times daily.  Historical Provider, MD  predniSONE (DELTASONE) 5 MG tablet Take 5 mg by mouth every other day.     Historical Provider, MD  warfarin (COUMADIN) 7.5 MG tablet Take 7.5 mg by mouth daily.    Historical Provider, MD   BP 125/82 mmHg  Pulse 98  Temp(Src) 98.8 F (37.1 C) (Oral)  Resp 16  Ht 5\' 2"  (1.575 m)  Wt 81.647 kg  BMI 32.91 kg/m2  SpO2 100%   Physical Exam  Constitutional: She is oriented to person, place, and time. She  appears well-developed and well-nourished.  HENT:  Head: Normocephalic and atraumatic.  Oropharynx is clear no postpharyngeal oral swelling, tongue normal, floor mouth soft, uvula midline and rises with phonation. Neck is supple full active range of motion  Eyes: Conjunctivae are normal. Pupils are equal, round, and reactive to light. Right eye exhibits no discharge. Left eye exhibits no discharge. No scleral icterus.  Neck: Normal range of motion. No JVD present. No tracheal deviation present.  Cardiovascular:  Murmur heard. Mechanical heart valve, murmur noted  Pulmonary/Chest: Effort normal and breath sounds normal. No stridor. No respiratory distress. She has no wheezes. She has no rales. She exhibits no tenderness.  Abdominal: Soft. She exhibits no distension and no mass. There is no tenderness. There is no rebound and no guarding.  Musculoskeletal: Normal range of motion. She exhibits no edema or tenderness.  Neurological: She is alert and oriented to person, place, and time. Coordination normal.  Skin: Skin is warm and dry. Rash noted. No erythema. No pallor.  Diffuse urticaria to face neck back chest upper and lower extremities, no edema noted  Psychiatric: She has a normal mood and affect. Her behavior is normal. Judgment and thought content normal.  Nursing note and vitals reviewed.   ED Course  Procedures (including critical care time) Labs Review Labs Reviewed - No data to display  Imaging Review No results found. I have personally reviewed and evaluated these images and lab results as part of my medical decision-making.   EKG Interpretation None      MDM   Final diagnoses:  Allergic reaction, initial encounter    Labs:   Imaging:  Consults:  Therapeutics: Benadryl  Discharge Meds:   Assessment/Plan: Patient presents with allergic reaction, most likely due to antibiotics therapy. No other new exposures noted. Patient has no signs of anaphylactic reaction,  she appears in no acute distress on my exam. Patient was originally prescribed antibiotics for peritonitis, she has completed 12 days of therapy so far, she has no signs or symptoms remaining. I feel at this point its reasonable to his continued therapy, with close follow-up with primary care for reevaluation. Patient is instructed to use Benadryl, Claritin, Zantac as needed for hives, she is to continue using her daily prednisone, and to follow-up with her primary care tomorrow for reevaluation. I stressed this that the primary care follow-up tomorrow was important as I will be discontinuing the antibiotics, she has an allergic reaction, and she has ongoing significant medical conditions that need to be monitored as well. Patient verbalized her understanding and agreement for today's plan and had no further questions or concerns at the time of discharge. Mother was present at the time of evaluation, she also understood and agreed to today's plan.         , PA-C 12/22/14 1642  12/24/14, MD 12/27/14 [phone]

## 2014-12-22 NOTE — ED Notes (Addendum)
She c/o onset red, itchy hives to BUE, trunk, face today after taking her cipro pill, shes been taking the antibiotic since 11/15 with no reaction. Denies trouble swallowing or breathing. Pt denies using any other new products/mediations today.

## 2014-12-22 NOTE — ED Notes (Signed)
See providers assessment.  

## 2015-01-26 ENCOUNTER — Inpatient Hospital Stay (HOSPITAL_COMMUNITY)
Admission: EM | Admit: 2015-01-26 | Discharge: 2015-01-31 | DRG: 872 | Disposition: A | Discharge status: Home / Self Care | Payer: Medicaid Other | Attending: Internal Medicine | Admitting: Internal Medicine

## 2015-01-26 ENCOUNTER — Encounter (HOSPITAL_COMMUNITY): Payer: Self-pay | Admitting: *Deleted

## 2015-01-26 ENCOUNTER — Emergency Department (HOSPITAL_COMMUNITY): Payer: Medicaid Other

## 2015-01-26 DIAGNOSIS — Z9189 Other specified personal risk factors, not elsewhere classified: Secondary | ICD-10-CM | POA: Insufficient documentation

## 2015-01-26 DIAGNOSIS — Y92239 Unspecified place in hospital as the place of occurrence of the external cause: Secondary | ICD-10-CM | POA: Diagnosis not present

## 2015-01-26 DIAGNOSIS — N390 Urinary tract infection, site not specified: Secondary | ICD-10-CM | POA: Diagnosis present

## 2015-01-26 DIAGNOSIS — Z7952 Long term (current) use of systemic steroids: Secondary | ICD-10-CM

## 2015-01-26 DIAGNOSIS — R197 Diarrhea, unspecified: Secondary | ICD-10-CM | POA: Diagnosis present

## 2015-01-26 DIAGNOSIS — Z79899 Other long term (current) drug therapy: Secondary | ICD-10-CM

## 2015-01-26 DIAGNOSIS — Z881 Allergy status to other antibiotic agents status: Secondary | ICD-10-CM

## 2015-01-26 DIAGNOSIS — I050 Rheumatic mitral stenosis: Secondary | ICD-10-CM | POA: Diagnosis present

## 2015-01-26 DIAGNOSIS — A419 Sepsis, unspecified organism: Principal | ICD-10-CM | POA: Diagnosis present

## 2015-01-26 DIAGNOSIS — T368X5A Adverse effect of other systemic antibiotics, initial encounter: Secondary | ICD-10-CM | POA: Diagnosis not present

## 2015-01-26 DIAGNOSIS — T361X5A Adverse effect of cephalosporins and other beta-lactam antibiotics, initial encounter: Secondary | ICD-10-CM | POA: Diagnosis not present

## 2015-01-26 DIAGNOSIS — R509 Fever, unspecified: Secondary | ICD-10-CM | POA: Insufficient documentation

## 2015-01-26 DIAGNOSIS — M329 Systemic lupus erythematosus, unspecified: Secondary | ICD-10-CM | POA: Diagnosis present

## 2015-01-26 DIAGNOSIS — N179 Acute kidney failure, unspecified: Secondary | ICD-10-CM | POA: Diagnosis present

## 2015-01-26 DIAGNOSIS — I10 Essential (primary) hypertension: Secondary | ICD-10-CM | POA: Diagnosis present

## 2015-01-26 DIAGNOSIS — Z952 Presence of prosthetic heart valve: Secondary | ICD-10-CM

## 2015-01-26 DIAGNOSIS — D649 Anemia, unspecified: Secondary | ICD-10-CM | POA: Diagnosis present

## 2015-01-26 DIAGNOSIS — T783XXA Angioneurotic edema, initial encounter: Secondary | ICD-10-CM | POA: Diagnosis not present

## 2015-01-26 DIAGNOSIS — E872 Acidosis: Secondary | ICD-10-CM | POA: Diagnosis present

## 2015-01-26 DIAGNOSIS — M3214 Glomerular disease in systemic lupus erythematosus: Secondary | ICD-10-CM | POA: Diagnosis present

## 2015-01-26 LAB — URINALYSIS, ROUTINE W REFLEX MICROSCOPIC
GLUCOSE, UA: NEGATIVE mg/dL
Ketones, ur: 15 mg/dL — AB
Nitrite: NEGATIVE
SPECIFIC GRAVITY, URINE: 1.03 (ref 1.005–1.030)
pH: 5 (ref 5.0–8.0)

## 2015-01-26 LAB — COMPREHENSIVE METABOLIC PANEL
ALT: 13 U/L — AB (ref 14–54)
AST: 22 U/L (ref 15–41)
Albumin: 2.5 g/dL — ABNORMAL LOW (ref 3.5–5.0)
Alkaline Phosphatase: 55 U/L (ref 38–126)
Anion gap: 9 (ref 5–15)
BUN: 31 mg/dL — ABNORMAL HIGH (ref 6–20)
CHLORIDE: 113 mmol/L — AB (ref 101–111)
CO2: 19 mmol/L — AB (ref 22–32)
CREATININE: 2.02 mg/dL — AB (ref 0.44–1.00)
Calcium: 8 mg/dL — ABNORMAL LOW (ref 8.9–10.3)
GFR, EST AFRICAN AMERICAN: 40 mL/min — AB (ref >60)
GFR, EST NON AFRICAN AMERICAN: 34 mL/min — AB (ref >60)
Glucose, Bld: 101 mg/dL — ABNORMAL HIGH (ref 65–99)
POTASSIUM: 4.1 mmol/L (ref 3.5–5.1)
SODIUM: 141 mmol/L (ref 135–145)
Total Bilirubin: 0.4 mg/dL (ref 0.3–1.2)
Total Protein: 7.1 g/dL (ref 6.5–8.1)

## 2015-01-26 LAB — CBC WITH DIFFERENTIAL/PLATELET
BASOS ABS: 0 10*3/uL (ref 0.0–0.1)
Basophils Relative: 0 %
EOS PCT: 2 %
Eosinophils Absolute: 0.2 10*3/uL (ref 0.0–0.7)
HEMATOCRIT: 25.2 % — AB (ref 36.0–46.0)
HEMOGLOBIN: 7.5 g/dL — AB (ref 12.0–15.0)
LYMPHS ABS: 1.3 10*3/uL (ref 0.7–4.0)
LYMPHS PCT: 11 %
MCH: 25 pg — AB (ref 26.0–34.0)
MCHC: 29.8 g/dL — ABNORMAL LOW (ref 30.0–36.0)
MCV: 84 fL (ref 78.0–100.0)
Monocytes Absolute: 0.5 10*3/uL (ref 0.1–1.0)
Monocytes Relative: 5 %
NEUTROS ABS: 9.2 10*3/uL — AB (ref 1.7–7.7)
NEUTROS PCT: 82 %
PLATELETS: 245 10*3/uL (ref 150–400)
RBC: 3 MIL/uL — AB (ref 3.87–5.11)
RDW: 16.9 % — ABNORMAL HIGH (ref 11.5–15.5)
WBC: 11.2 10*3/uL — ABNORMAL HIGH (ref 4.0–10.5)

## 2015-01-26 LAB — RETICULOCYTES
RBC.: 2.99 MIL/uL — AB (ref 3.87–5.11)
RETIC CT PCT: 0.7 % (ref 0.4–3.1)
Retic Count, Absolute: 20.9 10*3/uL (ref 19.0–186.0)

## 2015-01-26 LAB — URINE MICROSCOPIC-ADD ON

## 2015-01-26 LAB — IRON AND TIBC
IRON: 11 ug/dL — AB (ref 28–170)
Saturation Ratios: 6 % — ABNORMAL LOW (ref 10.4–31.8)
TIBC: 192 ug/dL — ABNORMAL LOW (ref 250–450)
UIBC: 181 ug/dL

## 2015-01-26 LAB — I-STAT BETA HCG BLOOD, ED (MC, WL, AP ONLY): I-stat hCG, quantitative: <5 m[IU]/mL (ref <5)

## 2015-01-26 LAB — I-STAT CG4 LACTIC ACID, ED
LACTIC ACID, VENOUS: 0.6 mmol/L (ref 0.5–2.0)
Lactic Acid, Venous: 0.77 mmol/L (ref 0.5–2.0)

## 2015-01-26 LAB — FERRITIN: FERRITIN: 208 ng/mL (ref 11–307)

## 2015-01-26 LAB — PROTIME-INR
INR: 1.53 — ABNORMAL HIGH (ref 0.00–1.49)
Prothrombin Time: 18.4 seconds — ABNORMAL HIGH (ref 11.6–15.2)

## 2015-01-26 LAB — LACTATE DEHYDROGENASE: LDH: 457 U/L — ABNORMAL HIGH (ref 98–192)

## 2015-01-26 MED ORDER — SODIUM CHLORIDE 0.9 % IV BOLUS (SEPSIS)
1000.0000 mL | Freq: Once | INTRAVENOUS | Status: AC
  Administered 2015-01-26: 1000 mL via INTRAVENOUS

## 2015-01-26 MED ORDER — ONDANSETRON HCL 4 MG PO TABS: 4.0000 mg | ORAL_TABLET | Freq: Four times a day (QID) | ORAL | Status: DC | PRN

## 2015-01-26 MED ORDER — DIPHENHYDRAMINE HCL 50 MG/ML IJ SOLN
25.0000 mg | Freq: Once | INTRAMUSCULAR | Status: AC
  Administered 2015-01-26: 25 mg via INTRAVENOUS

## 2015-01-26 MED ORDER — ACETAMINOPHEN 650 MG RE SUPP: 650.0000 mg | Freq: Four times a day (QID) | RECTAL | Status: DC | PRN

## 2015-01-26 MED ORDER — SODIUM CHLORIDE 0.9 % IJ SOLN
3.0000 mL | Freq: Two times a day (BID) | INTRAMUSCULAR | Status: DC
  Administered 2015-01-27 – 2015-01-30 (×5): 3 mL via INTRAVENOUS

## 2015-01-26 MED ORDER — VANCOMYCIN HCL IN DEXTROSE 750-5 MG/150ML-% IV SOLN
750.0000 mg | Freq: Two times a day (BID) | INTRAVENOUS | Status: DC
  Administered 2015-01-27 – 2015-01-28 (×3): 750 mg via INTRAVENOUS
  Filled 2015-01-26 (×4): qty 150

## 2015-01-26 MED ORDER — DEXTROSE 5 % IV SOLN
2.0000 g | INTRAVENOUS | Status: DC
  Administered 2015-01-26 – 2015-01-27 (×2): 2 g via INTRAVENOUS
  Filled 2015-01-26 (×3): qty 2

## 2015-01-26 MED ORDER — ACETAMINOPHEN 325 MG PO TABS
650.0000 mg | ORAL_TABLET | Freq: Four times a day (QID) | ORAL | Status: DC | PRN
  Administered 2015-01-26 – 2015-01-27 (×2): 650 mg via ORAL
  Filled 2015-01-26 (×3): qty 2

## 2015-01-26 MED ORDER — VANCOMYCIN HCL IN DEXTROSE 1-5 GM/200ML-% IV SOLN
1000.0000 mg | Freq: Once | INTRAVENOUS | Status: AC
  Administered 2015-01-26: 1000 mg via INTRAVENOUS
  Filled 2015-01-26: qty 200

## 2015-01-26 MED ORDER — MYCOPHENOLATE SODIUM 180 MG PO TBEC
360.0000 mg | DELAYED_RELEASE_TABLET | Freq: Two times a day (BID) | ORAL | Status: DC
  Administered 2015-01-26 – 2015-01-31 (×10): 360 mg via ORAL
  Filled 2015-01-26 (×11): qty 2

## 2015-01-26 MED ORDER — DIPHENHYDRAMINE HCL 50 MG/ML IJ SOLN
INTRAMUSCULAR | Status: AC
  Filled 2015-01-26: qty 1

## 2015-01-26 MED ORDER — ONDANSETRON HCL 4 MG/2ML IJ SOLN: 4.0000 mg | Freq: Four times a day (QID) | INTRAMUSCULAR | Status: DC | PRN

## 2015-01-26 MED ORDER — WARFARIN - PHARMACIST DOSING INPATIENT
Freq: Every day | Status: DC
Start: 2015-01-27 — End: 2015-01-27

## 2015-01-26 MED ORDER — SODIUM CHLORIDE 0.9 % IV BOLUS (SEPSIS)
1000.0000 mL | INTRAVENOUS | Status: AC
  Administered 2015-01-26 (×2): 1000 mL via INTRAVENOUS

## 2015-01-26 MED ORDER — PREDNISONE 5 MG PO TABS
5.0000 mg | ORAL_TABLET | ORAL | Status: DC
  Administered 2015-01-28 – 2015-01-30 (×2): 5 mg via ORAL
  Filled 2015-01-26 (×2): qty 1

## 2015-01-26 MED ORDER — SODIUM CHLORIDE 0.9 % IV SOLN
INTRAVENOUS | Status: AC
  Administered 2015-01-26: 20:00:00 via INTRAVENOUS

## 2015-01-26 MED ORDER — METOPROLOL TARTRATE 1 MG/ML IV SOLN
5.0000 mg | Freq: Once | INTRAVENOUS | Status: AC
  Administered 2015-01-26: 5 mg via INTRAVENOUS
  Filled 2015-01-26: qty 5

## 2015-01-26 MED ORDER — WARFARIN SODIUM 10 MG PO TABS
10.0000 mg | ORAL_TABLET | Freq: Once | ORAL | Status: AC
  Administered 2015-01-26: 10 mg via ORAL
  Filled 2015-01-26: qty 1

## 2015-01-26 MED ORDER — SODIUM CHLORIDE 0.9 % IV BOLUS (SEPSIS): 500.0000 mL | INTRAVENOUS | Status: AC

## 2015-01-26 NOTE — Progress Notes (Addendum)
Called by RN for lip swelling. At bedside, she appeared anxious and reports symptoms have been developing over the last few hours. She reports having a prior episode in the past though denies any family history. She also denies difficulty breathing, swallowing, tongue swelling, itching, new skin lesions.   Possibilities for her lip angioedema include drug reation to vancomycin or cefepime though no signs of histaminergic reaction, like rash or itching. Reaction would be a bit atypical for these agents. Prior notes at [facility] on Care Everywhere with lisinopril noted as a medication though unclear if she recently took this medication. No signs of respiratory distress or low O2 sats which are reassuring. Gave Benadryl 25mg  IV and will defer on steroids for now.   HR also noted to be 140s-150s and suspect it's related to T101.5 so will give metoprolol 5mg  IV and another 1L fluid bolus. Already treating for possible infection. PE a possibility given subtherapeutic INR though no respiratory distress. Lupus flare also possible but unclear of her baseline. Review of [facility] records notable for following labs with [reference ranges] at her rheumatology office visits which we will recheck in the AM.   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

## 2015-01-26 NOTE — ED Provider Notes (Signed)
CSN: [id number]     Arrival date & time 01/26/15  1258 History   First MD Initiated Contact with Patient 01/26/15 1507     Chief Complaint  Patient presents with  . Fever     (Consider location/radiation/quality/duration/timing/severity/associated sxs/prior Treatment) HPI Comments: PT comes in with cc of fevers. Pt has hx of SLE, Mitral valve replacement on coumadin. She is on prednisone. Pt comes in with cc of fevers. She reports that she has had fevers x 2 days. + cough, + loose BM x 2, but otherwise, Pt denies nausea, emesis, headaches, neck pain, urui like symptoms, chills, chest pains, shortness of breath, abdominal pain, uti like symptoms, rash, vaginal discharge. Temp max is 103.8. Pt took tylenol prior to ER arrival.   ROS 10 Systems reviewed and are negative for acute change except as noted in the HPI.      The history is provided by the patient.    Past Medical History  Diagnosis Date  . Lupus (HCC)   . Mitral valve stenosis   . Hypertension   . Renal insufficiency    Past Surgical History  Procedure Laterality Date  . Value replacement    . Laparoscopic appendectomy N/A 02/11/2013    Procedure: APPENDECTOMY LAPAROSCOPIC;  Surgeon: Liz Malady, MD;  Location: [facility] OR;  Service: General;  Laterality: N/A;  . Laparoscopic salpingo oopherectomy Right 02/11/2013    Procedure: LAPAROSCOPIC SALPINGO OOPHORECTOMY;  Surgeon: Lazaro Arms, MD;  Location: [facility] OR;  Service: Gynecology;  Laterality: Right;  . Tonsillectomy    . Appendectomy  January 2015  . Esophagogastroduodenoscopy N/A 02/14/2013    Procedure: ESOPHAGOGASTRODUODENOSCOPY (EGD);  Surgeon: Hart Carwin, MD;  Location: [facility] ENDOSCOPY;  Service: Endoscopy;  Laterality: N/A;  . Cardiac surgery     History reviewed. No pertinent family history. Social History  Substance Use Topics  . Smoking status: Never Smoker   . Smokeless tobacco: Never Used  . Alcohol Use: No   OB History    No data available      Review of Systems    Allergies  Ciprofloxacin  Home Medications   Prior to Admission medications   Medication Sig Start Date End Date Taking? Authorizing Provider  acetaminophen (TYLENOL) 325 MG tablet Take 650 mg by mouth every 6 (six) hours as needed for mild pain.   Yes Historical Provider, MD  mycophenolate (MYFORTIC) 360 MG TBEC EC tablet Take 360 mg by mouth 2 (two) times daily.   Yes Historical Provider, MD  predniSONE (DELTASONE) 5 MG tablet Take 5 mg by mouth every other day.    Yes Historical Provider, MD  warfarin (COUMADIN) 7.5 MG tablet Take 7.5 mg by mouth daily.   Yes Historical Provider, MD   BP 124/88 mmHg  Pulse 103  Temp(Src) 98.9 F (37.2 C) (Oral)  Resp 18  Ht 5\' 2"  (1.575 m)  Wt 179 lb 0.2 oz (81.2 kg)  BMI 32.73 kg/m2  SpO2 100%  LMP 01/08/2015 Physical Exam  Constitutional: She is oriented to person, place, and time. She appears well-developed.  HENT:  Head: Normocephalic and atraumatic.  Eyes: Conjunctivae and EOM are normal. Pupils are equal, round, and reactive to light.  Neck: Normal range of motion. Neck supple.  Cardiovascular: Regular rhythm and normal heart sounds.   Pulmonary/Chest: Effort normal and breath sounds normal. No respiratory distress.  Abdominal: Soft. Bowel sounds are normal. She exhibits no distension. There is no tenderness. There is no rebound and no guarding.  Neurological:  She is alert and oriented to person, place, and time.  Skin: Skin is warm and dry.  Nursing note and vitals reviewed.   ED Course  Procedures (including critical care time) Labs Review Labs Reviewed  COMPREHENSIVE METABOLIC PANEL - Abnormal; Notable for the following:    Chloride 113 (*)    CO2 19 (*)    Glucose, Bld 101 (*)    BUN 31 (*)    Creatinine, Ser 2.02 (*)    Calcium 8.0 (*)    Albumin 2.5 (*)    ALT 13 (*)    GFR calc non Af Amer 34 (*)    GFR calc Af Amer 40 (*)    All other components within normal limits  CBC WITH  DIFFERENTIAL/PLATELET - Abnormal; Notable for the following:    WBC 11.2 (*)    RBC 3.00 (*)    Hemoglobin 7.5 (*)    HCT 25.2 (*)    MCH 25.0 (*)    MCHC 29.8 (*)    RDW 16.9 (*)    Neutro Abs 9.2 (*)    All other components within normal limits  URINALYSIS, ROUTINE W REFLEX MICROSCOPIC (NOT AT [facility]) - Abnormal; Notable for the following:    Color, Urine AMBER (*)    APPearance TURBID (*)    Hgb urine dipstick SMALL (*)    Bilirubin Urine SMALL (*)    Ketones, ur 15 (*)    Protein, ur >300 (*)    Leukocytes, UA MODERATE (*)    All other components within normal limits  PROTIME-INR - Abnormal; Notable for the following:    Prothrombin Time 18.4 (*)    INR 1.53 (*)    All other components within normal limits  URINE MICROSCOPIC-ADD ON - Abnormal; Notable for the following:    Squamous Epithelial / LPF 6-30 (*)    Bacteria, UA MANY (*)    Casts GRANULAR CAST (*)    All other components within normal limits  RETICULOCYTES - Abnormal; Notable for the following:    RBC. 2.99 (*)    All other components within normal limits  BASIC METABOLIC PANEL - Abnormal; Notable for the following:    Chloride 119 (*)    CO2 15 (*)    Glucose, Bld 104 (*)    BUN 29 (*)    Creatinine, Ser 1.92 (*)    Calcium 7.2 (*)    GFR calc non Af Amer 37 (*)    GFR calc Af Amer 42 (*)    All other components within normal limits  CBC - Abnormal; Notable for the following:    RBC 2.49 (*)    Hemoglobin 6.4 (*)    HCT 21.1 (*)    MCH 25.7 (*)    RDW 17.2 (*)    All other components within normal limits  LACTATE DEHYDROGENASE - Abnormal; Notable for the following:    LDH 457 (*)    All other components within normal limits  PROTIME-INR - Abnormal; Notable for the following:    Prothrombin Time 23.4 (*)    INR 2.10 (*)    All other components within normal limits  IRON AND TIBC - Abnormal; Notable for the following:    Iron 11 (*)    TIBC 192 (*)    Saturation Ratios 6 (*)    All other  components within normal limits  PROTEIN / CREATININE RATIO, URINE - Abnormal; Notable for the following:    Protein Creatinine Ratio 2.44 (*)  All other components within normal limits  CBC - Abnormal; Notable for the following:    RBC 3.17 (*)    Hemoglobin 8.3 (*)    HCT 26.1 (*)    RDW 16.5 (*)    All other components within normal limits  CULTURE, BLOOD (ROUTINE X 2)  CULTURE, BLOOD (ROUTINE X 2)  URINE CULTURE  MRSA PCR SCREENING  FERRITIN  INFLUENZA PANEL BY PCR (TYPE A & B, H1N1)  HAPTOGLOBIN  HIV ANTIBODY (ROUTINE TESTING)  SAVE SMEAR  ANTI-DNA ANTIBODY, DOUBLE-STRANDED  COMPLEMENT, TOTAL  C3 COMPLEMENT  C4 COMPLEMENT  PROTIME-INR  CBC  BASIC METABOLIC PANEL  I-STAT CG4 LACTIC ACID, ED  I-STAT BETA HCG BLOOD, ED ([facility], [facility], [facility] ONLY)  I-STAT CG4 LACTIC ACID, ED  POCT GASTRIC OCCULT BLOOD (1-CARD TO LAB)  POC OCCULT BLOOD, ED  TYPE AND SCREEN  PREPARE RBC (CROSSMATCH)    Imaging Review Dg Chest 2 View  01/26/2015  CLINICAL DATA:  Productive cough and fever. EXAM: CHEST - 2 VIEW COMPARISON:  09/05/2014 FINDINGS: The heart size and mediastinal contours are within normal limits. Stable appearance of mitral valve prosthesis. There is no evidence of pulmonary edema, consolidation, pneumothorax, nodule or pleural fluid. The visualized skeletal structures are unremarkable. IMPRESSION: No active disease. Electronically Signed   By: Irish Lack M.D.   On: 01/26/2015 14:46   I have personally reviewed and evaluated these images and lab results as part of my medical decision-making.   EKG Interpretation   Date/Time:  Sunday January 26 2015 15:27:33 EST Ventricular Rate:  103 PR Interval:  150 QRS Duration: 76 QT Interval:  336 QTC Calculation: 440 R Axis:   101 Text Interpretation:  Sinus tachycardia Probable left atrial enlargement  Borderline right axis deviation No acute changes No significant change  since last tracing Confirmed by Teigen Parslow, MD, Janey Genta [phone]) on  01/26/2015  3:35:33 PM      MDM   Final diagnoses:  AKI (acute kidney injury) (HCC)  At high risk for severe sepsis    Pt comes in with fevers. She is tachycardic, had a fevers, slight elevated WC. Undifferentiated sepsis - Possible endocarditis, bacteremia. Pt has AKI - so by old definition, she has severe sepsis. Lactate is < 2. Broad spectrum antibiotics started, fluid started.    Derwood Kaplan, MD 01/27/15 2325

## 2015-01-26 NOTE — Progress Notes (Signed)
Pharmacy Antibiotic Follow-up Note  Joan Miles is a 20 y.o. year-old female admitted on 01/26/2015.  The patient is currently on day 1 of Vancomycin and Cefepime for r/o sepsis.  Assessment/Plan: The patient received Vancomycin 1gm IV x 1 in the ED at 1700.  Order was not continued on admission.  Paged MD and confirmed, want to continue Vancomycin along with Cefepime.  Pt with elevated SCr 2 (baseline <1).  Chose slightly higher dosing interval in hopes that renal function will improve with hydration.  If not, may need dose adjustment.  Vancomycin 750mg  IV q12h - next dose 1/2 at 0500. Follow up renal function, culture data, and clinical progress.  Temp (24hrs), Avg:99.2 F (37.3 C), Min:98.5 F (36.9 C), Max:99.7 F (37.6 C)   Recent Labs Lab 01/26/15 1324  WBC 11.2*    Recent Labs Lab 01/26/15 1324  CREATININE 2.02*   Estimated Creatinine Clearance: 42.9 mL/min (by C-G formula based on Cr of 2.02).    Allergies  Allergen Reactions  . Ciprofloxacin Hives    Antimicrobials this admission: Vanc 1/1 >> Cefepime 1/1 >>  Levels/dose changes this admission: n/a  Microbiology results: pending  Thank you for allowing pharmacy to be a part of this patient's care.  03/26/15, Pharm.D., BCPS Clinical Pharmacist Pager [phone] 01/26/2015 8:24 PM

## 2015-01-26 NOTE — ED Notes (Addendum)
Pt reports having fever x 2 days, denies all other symptoms except mild cough. HR 120 at triage.

## 2015-01-26 NOTE — Progress Notes (Signed)
ANTICOAGULATION CONSULT NOTE - Follow Up Consult  Pharmacy Consult for Warfarin Indication: mechanical mitral valve  Allergies  Allergen Reactions  . Ciprofloxacin Hives    Patient Measurements: Height: 5\' 2"  (157.5 cm) Weight: 171 lb (77.565 kg) IBW/kg (Calculated) : 50.1  Vital Signs: Temp: 99.7 F (37.6 C) (01/01 1747) Temp Source: Oral (01/01 1747) BP: 168/87 mmHg (01/01 1747) Pulse Rate: 118 (01/01 1747)  Labs:  Recent Labs  01/26/15 1324 01/26/15 1651  HGB 7.5*  --   HCT 25.2*  --   PLT 245  --   LABPROT  --  18.4*  INR  --  1.53*  CREATININE 2.02*  --     Estimated Creatinine Clearance: 42.9 mL/min (by C-G formula based on Cr of 2.02).   Medications:  Coumadin 7.5mg  daily - last dose 12/31 at 9pm  Assessment: 20 year old female presenting with possible sepsis. 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 Her INR is subtherapeutic.  Goal of Therapy:  INR 2.5-3.5   Plan:  Coumadin 10mg  today Daily PT/INR  26, Pharm.D., BCPS, AAHIVP Clinical Pharmacist Phone: [phone] or [phone] 01/26/2015, 6:17 PM

## 2015-01-26 NOTE — ED Notes (Signed)
This nurse tried x 2 for IV.  Cassie RN started IV left arm #20, unable to start second IV.

## 2015-01-26 NOTE — Progress Notes (Signed)
ANTIBIOTIC CONSULT NOTE - INITIAL  Pharmacy Consult for cefepime Indication: sepsis, possible endocarditis  Allergies  Allergen Reactions  . Ciprofloxacin Hives    Patient Measurements: Height: 5\' 2"  (157.5 cm) Weight: 171 lb (77.565 kg) IBW/kg (Calculated) : 50.1 Adjusted Body Weight: 61.1 kg  Vital Signs: Temp: 98.5 F (36.9 C) (01/01 1304) Temp Source: Oral (01/01 1304) BP: 120/67 mmHg (01/01 1304) Pulse Rate: 120 (01/01 1304) Intake/Output from previous day:   Intake/Output from this shift:    Labs:  Recent Labs  01/26/15 1324  WBC 11.2*  HGB 7.5*  PLT 245  CREATININE 2.02*   Estimated Creatinine Clearance: 42.9 mL/min (by C-G formula based on Cr of 2.02). No results for input(s): VANCOTROUGH, VANCOPEAK, VANCORANDOM, GENTTROUGH, GENTPEAK, GENTRANDOM, TOBRATROUGH, TOBRAPEAK, TOBRARND, AMIKACINPEAK, AMIKACINTROU, AMIKACIN in the last 72 hours.   Microbiology: No results found for this or any previous visit (from the past 720 hour(s)).  Medical History: Past Medical History  Diagnosis Date  . Lupus (HCC)   . Mitral valve stenosis   . Hypertension   . Renal insufficiency    Assessment: 20 yo F presents to ED with fever x2 days and mild cough. Her past medical history is significant for mitral valve replacement on Coumadin and SLE.  She was seen in the ED back in November for parotitis. During that visit the patient was discharged with Clinda/Cipro. She subsequently developed hives after taking a Cipro tablet. This occurred ~12 days after starting antibiotics.  Today, patient has a reported Tmax of 103.8, WBC 11.2, SCr 2.02 (BL= ~0.8, CrCl ~40 ml/min. Pharmacy consulted to start cefepime.  Goal of Therapy:  Eradication of infection  Plan:  - Cefepime 2g IV q24h - Monitor SCr, CBC and clinical progression - Follow up C&S and LOT  December, PharmD. PGY-1 Pharmacy Resident Pager: [phone]  01/26/2015,3:28 PM

## 2015-01-26 NOTE — H&P (Signed)
Date: 01/26/2015               Patient Name:  Joan Miles MRN: [id number]  DOB: Dec 06, 1995 Age / Sex: 20 y.o., female   PCP: Timothy Lasso, MD         Medical Service: Internal Medicine Teaching Service         Attending Physician: Dr. Earl Lagos, MD    First Contact: Dr. Ruben Im Pager: [phone]  Second Contact: Dr. Griffin Basil Pager: [phone]       After Hours (After 5p/  First Contact Pager: [phone]  weekends / holidays): Second Contact Pager: [phone]   Chief Complaint: Fever  History of Present Illness:   Joan Miles is a 20 year old woman with a PMH of SLE, mitral valve replacement on warfarin, HTN  who presents with a fever, chills, and night sweats that started on Friday. She noticed chills when she was at work at [employer], and when she took her temperature, it was 103.8. She endorses an occasionally productive cough with green sputum and congestion. She also had two non-bloody loose stools over the weekend. She said several of her coworkers have had colds. She denies unintended weight loss, light-headedness, headaches, myalgias, sinus pain, ear pain, neck stiffness, chest pain, shortness of breath, hemoptysis,  dysuria, vaginal discharge, abdominal pain, nausea, vomiting, leg pain or swelling. She denies tobacco, alcohol, illicit drug use, or sexual activity. She lives at home with her mother. She has a family history of HTN and SLE.   In the ED, she was afebrile but tachycardic to the 120s. Blood and urine cultures were drawn. She was hemodynamically stable. CXR was negative. UA showed moderate leukocytes, proteinuria, turbid appearance, hemoglobinuria. She had a mild leukocytosis to 11.2. Urine microscopy showed many squamous cells and many bacteria.  Lactic acid was normal. She was started on vancomycin and cefepime. She was noted to be anemic to 7.5 with INR of 1.5, with baseline Hgb 11-12, but 8.1 on 11/15. She also had a creatinine of 2.02 from a baseline  of <1, but it was 1.11 on 11/15.   Meds: Current Facility-Administered Medications  Medication Dose Route Frequency Provider Last Rate Last Dose  . 0.9 %  sodium chloride infusion   Intravenous Continuous Lora Paula, MD      . acetaminophen (TYLENOL) tablet 650 mg  650 mg Oral Q6H PRN Lora Paula, MD       Or  . acetaminophen (TYLENOL) suppository 650 mg  650 mg Rectal Q6H PRN Lora Paula, MD      . ceFEPIme (MAXIPIME) 2 g in dextrose 5 % 50 mL IVPB  2 g Intravenous Q24H Ancil Boozer, [facility]   Stopped at 01/26/15 1702  . mycophenolate (MYFORTIC) EC tablet 360 mg  360 mg Oral BID Lora Paula, MD      . ondansetron [facility]) tablet 4 mg  4 mg Oral Q6H PRN Lora Paula, MD       Or  . ondansetron [facility]) injection 4 mg  4 mg Intravenous Q6H PRN Lora Paula, MD      . Melene Muller ON 01/28/2015] predniSONE (DELTASONE) tablet 5 mg  5 mg Oral QODAY Lora Paula, MD      . sodium chloride 0.9 % bolus 500 mL  500 mL Intravenous Q1H Ankit Nanavati, MD      . sodium chloride 0.9 % injection 3 mL  3 mL Intravenous Q12H Lora Paula, MD  Allergies: Allergies as of 01/26/2015 - Review Complete 01/26/2015  Allergen Reaction Noted  . Ciprofloxacin Hives 01/26/2015   Past Medical History  Diagnosis Date  . Lupus (HCC)   . Mitral valve stenosis   . Hypertension   . Renal insufficiency    Past Surgical History  Procedure Laterality Date  . Value replacement    . Laparoscopic appendectomy N/A 02/11/2013    Procedure: APPENDECTOMY LAPAROSCOPIC;  Surgeon: Liz Malady, MD;  Location: [facility] OR;  Service: General;  Laterality: N/A;  . Laparoscopic salpingo oopherectomy Right 02/11/2013    Procedure: LAPAROSCOPIC SALPINGO OOPHORECTOMY;  Surgeon: Lazaro Arms, MD;  Location: [facility] OR;  Service: Gynecology;  Laterality: Right;  . Tonsillectomy    . Appendectomy  January 2015  . Esophagogastroduodenoscopy N/A 02/14/2013    Procedure: ESOPHAGOGASTRODUODENOSCOPY (EGD);   Surgeon: Hart Carwin, MD;  Location: [facility] ENDOSCOPY;  Service: Endoscopy;  Laterality: N/A;  . Cardiac surgery     History reviewed. No pertinent family history. Social History   Social History  . Marital Status: Single    Spouse Name: N/A  . Number of Children: N/A  . Years of Education: N/A   Occupational History  . Not on file.   Social History Main Topics  . Smoking status: Never Smoker   . Smokeless tobacco: Never Used  . Alcohol Use: No  . Drug Use: No  . Sexual Activity: Not Currently   Other Topics Concern  . Not on file   Social History Narrative    Review of Systems: Negative except per HPI  Physical Exam: Blood pressure 168/87, pulse 118, temperature 99.7 F (37.6 C), temperature source Oral, resp. rate 25, height 5\' 2"  (1.575 m), weight 171 lb (77.565 kg), last menstrual period 01/08/2015, SpO2 98 %. General: Lying in bed, no acute distress. Not diaphoretic, no rigors. HEENT: Sclerae anicteric. PERRL. EOMI. Tympanic membrane visualized and in tact without erythema. No tonsillar erythema or exudates, Moist mucous membranes, no cervical adenopathy,  Cardiovascular: Tachycardic. Regular rhythm. No mumurs, rubs, or gallops Pulmonary: Clear to ausculation bilaterally. Resonant to percussion Abdominal: Soft, non-tender, non-distended. Normal bowel sounds Extremities:  No clubbing, cyanosis or edema Skin: No rashes. Warm and dry. Neurological: Tongue midline, face symmetric.  Psychiatric: Normal behavior and affect  Lab results: Basic Metabolic Panel:  Recent Labs  01/10/2015 1324  NA 141  K 4.1  CL 113*  CO2 19*  GLUCOSE 101*  BUN 31*  CREATININE 2.02*  CALCIUM 8.0*   Liver Function Tests:  Recent Labs  01/26/15 1324  AST 22  ALT 13*  ALKPHOS 55  BILITOT 0.4  PROT 7.1  ALBUMIN 2.5*   CBC:  Recent Labs  01/26/15 1324  WBC 11.2*  NEUTROABS 9.2*  HGB 7.5*  HCT 25.2*  MCV 84.0  PLT 245   Coagulation:  Recent Labs  01/26/15 1651    LABPROT 18.4*  INR 1.53*   Urinalysis:  Recent Labs  01/26/15 1452  COLORURINE AMBER*  LABSPEC 1.030  PHURINE 5.0  GLUCOSEU NEGATIVE  HGBUR SMALL*  BILIRUBINUR SMALL*  KETONESUR 15*  PROTEINUR >300*  NITRITE NEGATIVE  LEUKOCYTESUR MODERATE*    Imaging results:  Dg Chest 2 View  01/26/2015  CLINICAL DATA:  Productive cough and fever. EXAM: CHEST - 2 VIEW COMPARISON:  09/05/2014 FINDINGS: The heart size and mediastinal contours are within normal limits. Stable appearance of mitral valve prosthesis. There is no evidence of pulmonary edema, consolidation, pneumothorax, nodule or pleural fluid. The visualized skeletal structures  are unremarkable. IMPRESSION: No active disease. Electronically Signed   By: Irish Lack M.D.   On: 01/26/2015 14:46    EKG: Sinus tachycardia  Assessment & Plan by Problem: Active Problems:   AKI (acute kidney injury) (HCC)   Fever  Fever: Patient meets 2/4 SIRS criteria without a clear source of infection. Immunocompromised with SLE regimen. She endorses a productive cough, but CXR is clear. Her UA is contaminated, and she denies symptoms of dysuria. A viral illness, in conjunction with her diarrhea, could also explain her fever. Patient is in a hypercoagulable state and is tachycardic, but she denies any chest pain, leg swelling, shortness of breath, and is not hypoxic. Her D-dimer was elevated in June, and a CT angio at that time was negative. Wells score is currently 1.5 (low risk). 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 Endocarditis is a strong consideration given her artificial valve. We will monitor clinical improvement on antibiotics and await blood culture results.  - Continue vancomycin and cefepime IV - Blood and urine cultures pending.  - Flu panel pending - HIV ab pending - Tylenol prn - CBC in AM  AKI: Patient had an elevated creatinine at 1.11 in November at an ED  visit for facial swelling, it is now 2. Proteinuria noted on UA. Could be related to worsening SLE or hypoperfusion in the setting of possible sepsis. Glomerulonephritis/nephrotic syndrome is also a consideration. - 100 cc/hr NS - Consider 24 hours urine protein if no improvement  Anemia:  Hgb is down to 7.5. No bleeding noted or bruising. Had her period 2 weeks ago.  - Reticulocytes, TIBC, haptoglobin, LDH pending  SLE: Mycophenolate 360 mg BID, Prednisone 5 mg  Valve Replacement: Warfarin per pharm.  DVT Prophylaxis: Warfarin Code Status: Full Diet: Regular  Dispo: Disposition is deferred at this time, awaiting improvement of current medical problems. Anticipated discharge in approximately 2-3 day(s).   The patient does have a current PCP Timothy Lasso, MD) and does not need an [facility] hospital follow-up appointment after discharge.  The patient does not have transportation limitations that hinder transportation to clinic appointments.  Signed: Ruben Im, MD 01/26/2015, 6:10 PM

## 2015-01-27 DIAGNOSIS — Z881 Allergy status to other antibiotic agents status: Secondary | ICD-10-CM | POA: Diagnosis not present

## 2015-01-27 DIAGNOSIS — R509 Fever, unspecified: Secondary | ICD-10-CM

## 2015-01-27 DIAGNOSIS — M329 Systemic lupus erythematosus, unspecified: Secondary | ICD-10-CM | POA: Diagnosis present

## 2015-01-27 DIAGNOSIS — E872 Acidosis: Secondary | ICD-10-CM | POA: Diagnosis present

## 2015-01-27 DIAGNOSIS — N179 Acute kidney failure, unspecified: Secondary | ICD-10-CM | POA: Diagnosis present

## 2015-01-27 DIAGNOSIS — T783XXA Angioneurotic edema, initial encounter: Secondary | ICD-10-CM | POA: Diagnosis not present

## 2015-01-27 DIAGNOSIS — M3214 Glomerular disease in systemic lupus erythematosus: Secondary | ICD-10-CM | POA: Diagnosis present

## 2015-01-27 DIAGNOSIS — N390 Urinary tract infection, site not specified: Secondary | ICD-10-CM

## 2015-01-27 DIAGNOSIS — A419 Sepsis, unspecified organism: Secondary | ICD-10-CM | POA: Diagnosis not present

## 2015-01-27 DIAGNOSIS — Z952 Presence of prosthetic heart valve: Secondary | ICD-10-CM | POA: Diagnosis not present

## 2015-01-27 DIAGNOSIS — D649 Anemia, unspecified: Secondary | ICD-10-CM | POA: Diagnosis present

## 2015-01-27 DIAGNOSIS — T368X5A Adverse effect of other systemic antibiotics, initial encounter: Secondary | ICD-10-CM | POA: Diagnosis not present

## 2015-01-27 DIAGNOSIS — Z9189 Other specified personal risk factors, not elsewhere classified: Secondary | ICD-10-CM | POA: Insufficient documentation

## 2015-01-27 DIAGNOSIS — T361X5A Adverse effect of cephalosporins and other beta-lactam antibiotics, initial encounter: Secondary | ICD-10-CM | POA: Diagnosis not present

## 2015-01-27 DIAGNOSIS — I10 Essential (primary) hypertension: Secondary | ICD-10-CM | POA: Diagnosis present

## 2015-01-27 DIAGNOSIS — R197 Diarrhea, unspecified: Secondary | ICD-10-CM | POA: Diagnosis present

## 2015-01-27 DIAGNOSIS — Z7952 Long term (current) use of systemic steroids: Secondary | ICD-10-CM | POA: Diagnosis not present

## 2015-01-27 DIAGNOSIS — I050 Rheumatic mitral stenosis: Secondary | ICD-10-CM | POA: Diagnosis present

## 2015-01-27 DIAGNOSIS — Z79899 Other long term (current) drug therapy: Secondary | ICD-10-CM | POA: Diagnosis not present

## 2015-01-27 DIAGNOSIS — Y92239 Unspecified place in hospital as the place of occurrence of the external cause: Secondary | ICD-10-CM | POA: Diagnosis not present

## 2015-01-27 LAB — CBC
HEMATOCRIT: 21.1 % — AB (ref 36.0–46.0)
HEMOGLOBIN: 6.4 g/dL — AB (ref 12.0–15.0)
MCH: 25.7 pg — AB (ref 26.0–34.0)
MCHC: 30.3 g/dL (ref 30.0–36.0)
MCV: 84.7 fL (ref 78.0–100.0)
Platelets: 203 10*3/uL (ref 150–400)
RBC: 2.49 MIL/uL — AB (ref 3.87–5.11)
RDW: 17.2 % — ABNORMAL HIGH (ref 11.5–15.5)
WBC: 10.2 10*3/uL (ref 4.0–10.5)

## 2015-01-27 LAB — PROTEIN / CREATININE RATIO, URINE
Creatinine, Urine: 104.12 mg/dL
Protein Creatinine Ratio: 2.44 mg/mg{Cre} — ABNORMAL HIGH (ref 0.00–0.15)
Total Protein, Urine: 254 mg/dL

## 2015-01-27 LAB — INFLUENZA PANEL BY PCR (TYPE A & B)
H1N1 flu by pcr: NOT DETECTED
INFLBPCR: NEGATIVE
Influenza A By PCR: NEGATIVE

## 2015-01-27 LAB — BASIC METABOLIC PANEL
ANION GAP: 9 (ref 5–15)
BUN: 29 mg/dL — ABNORMAL HIGH (ref 6–20)
CALCIUM: 7.2 mg/dL — AB (ref 8.9–10.3)
CO2: 15 mmol/L — AB (ref 22–32)
Chloride: 119 mmol/L — ABNORMAL HIGH (ref 101–111)
Creatinine, Ser: 1.92 mg/dL — ABNORMAL HIGH (ref 0.44–1.00)
GFR calc non Af Amer: 37 mL/min — ABNORMAL LOW (ref >60)
GFR, EST AFRICAN AMERICAN: 42 mL/min — AB (ref >60)
GLUCOSE: 104 mg/dL — AB (ref 65–99)
POTASSIUM: 4.1 mmol/L (ref 3.5–5.1)
Sodium: 143 mmol/L (ref 135–145)

## 2015-01-27 LAB — URINE CULTURE

## 2015-01-27 LAB — PREPARE RBC (CROSSMATCH)

## 2015-01-27 LAB — PROTIME-INR
INR: 2.1 — ABNORMAL HIGH (ref 0.00–1.49)
PROTHROMBIN TIME: 23.4 s — AB (ref 11.6–15.2)

## 2015-01-27 LAB — MRSA PCR SCREENING: MRSA by PCR: NEGATIVE

## 2015-01-27 LAB — HIV ANTIBODY (ROUTINE TESTING W REFLEX): HIV SCREEN 4TH GENERATION: NONREACTIVE

## 2015-01-27 LAB — SAVE SMEAR

## 2015-01-27 LAB — HAPTOGLOBIN: HAPTOGLOBIN: 154 mg/dL (ref 34–200)

## 2015-01-27 MED ORDER — ACETAMINOPHEN 650 MG RE SUPP: 650.0000 mg | RECTAL | Status: DC | PRN

## 2015-01-27 MED ORDER — WARFARIN - PHARMACIST DOSING INPATIENT
Freq: Every day | Status: DC
  Administered 2015-01-27: 1

## 2015-01-27 MED ORDER — WARFARIN SODIUM 7.5 MG PO TABS
7.5000 mg | ORAL_TABLET | Freq: Once | ORAL | Status: AC
  Administered 2015-01-27: 7.5 mg via ORAL
  Filled 2015-01-27: qty 1

## 2015-01-27 MED ORDER — DEXTROSE-NACL 5-0.45 % IV SOLN
INTRAVENOUS | Status: DC
  Administered 2015-01-27 – 2015-01-31 (×4): via INTRAVENOUS

## 2015-01-27 MED ORDER — SODIUM CHLORIDE 0.9 % IV SOLN: Freq: Once | INTRAVENOUS | Status: DC

## 2015-01-27 MED ORDER — ACETAMINOPHEN 325 MG PO TABS
650.0000 mg | ORAL_TABLET | ORAL | Status: DC | PRN
  Administered 2015-01-27 – 2015-01-28 (×3): 650 mg via ORAL
  Filled 2015-01-27 (×3): qty 2

## 2015-01-27 NOTE — Progress Notes (Signed)
ANTICOAGULATION CONSULT NOTE - Follow Up Consult  Pharmacy Consult for Warfarin Indication: mechanical mitral valve  Allergies  Allergen Reactions  . Ciprofloxacin Hives    Patient Measurements: Height: 5\' 2"  (157.5 cm) Weight: 179 lb 0.2 oz (81.2 kg) IBW/kg (Calculated) : 50.1  Vital Signs: Temp: 98.8 F (37.1 C) (01/02 1030) Temp Source: Oral (01/02 1030) BP: 115/75 mmHg (01/02 1030) Pulse Rate: 102 (01/02 1030)  Labs:  Recent Labs  01/26/15 1324 01/26/15 1651 01/27/15 0513  HGB 7.5*  --  6.4*  HCT 25.2*  --  21.1*  PLT 245  --  203  LABPROT  --  18.4* 23.4*  INR  --  1.53* 2.10*  CREATININE 2.02*  --  1.92*    Estimated Creatinine Clearance: 46.1 mL/min (by C-G formula based on Cr of 1.92).  Medications:  Coumadin 7.5mg  daily   Assessment: 20 year old female presenting with possible sepsis. 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 Her INR remains subtherapeutic at 2.1. Hgb dropped to 6.4 but no over bleeding noted.   Goal of Therapy:  INR 2.5-3.5   Plan:  - Warfarin 7.5mg  PO x 1 tonight - Daily INR  26, PharmD, BCPS Pager # [phone] 01/27/2015 11:16 AM

## 2015-01-27 NOTE — Progress Notes (Signed)
MD notified of temp 102.1, will hold off on giving blood at present per MD request, will continue to monitor. Darrel Hoover 8:29 AM

## 2015-01-27 NOTE — Progress Notes (Signed)
Subjective:  Joan Miles says she has had a cough overnight and some lip swelling. She denies any periorbital swelling or tongue swelling. She reports it's similar to the swelling she had had in November. She has had a persistent cough, but denies any abdominal pain, dysuria, or rashes. She has also had a temperature of ~103F. This morning, she also confirmed that her valve replacement was Mitral. She also reports that she typically gets a rash with her lupus flairs, and her current symptoms are dissimilar from previous lupus flairs.  Objective: Vital signs in last 24 hours: Filed Vitals:   01/27/15 0845 01/27/15 0900 01/27/15 0901 01/27/15 0915  BP: 99/51 77/68 102/65 95/62  Pulse: 120 114 114 91  Temp:      TempSrc:      Resp: 23 29 35 29  Height:      Weight:      SpO2: 100% 100% 100% 100%   Weight change:   Intake/Output Summary (Last 24 hours) at 01/27/15 1004 Last data filed at 01/27/15 0900  Gross per 24 hour  Intake 4328.33 ml  Output    900 ml  Net 3428.33 ml   Physical exam: General: Lying in bed, no acute distress. Mildly diaphoretic, no rigors HEENT: Mild lip swelling, no peri-orbital or lingual swelling Cardiovascular: Tachycardic. Regular rhythm. No mumurs, rubs, or gallops Pulmonary: Clear to ausculation bilaterally. Good air movement bilaterally Abdominal: Soft, non-tender, non-distended. Extremities: No clubbing, cyanosis or edema Skin: No rashes. Warm and dry. Psychiatric: Cheerful affect. Normal behavior.  Lab Results: Basic Metabolic Panel:  Recent Labs Lab 01/26/15 1324 01/27/15 0513  NA 141 143  K 4.1 4.1  CL 113* 119*  CO2 19* 15*  GLUCOSE 101* 104*  BUN 31* 29*  CREATININE 2.02* 1.92*  CALCIUM 8.0* 7.2*   Liver Function Tests:  Recent Labs Lab 01/26/15 1324  AST 22  ALT 13*  ALKPHOS 55  BILITOT 0.4  PROT 7.1  ALBUMIN 2.5*  CBC:  Recent Labs Lab 01/26/15 1324 01/27/15 0513  WBC 11.2* 10.2  NEUTROABS 9.2*  --   HGB 7.5*  6.4*  HCT 25.2* 21.1*  MCV 84.0 84.7  PLT 245 203   Coagulation:  Recent Labs Lab 01/26/15 1651 01/27/15 0513  LABPROT 18.4* 23.4*  INR 1.53* 2.10*   Anemia Panel:  Recent Labs Lab 01/26/15 1914  FERRITIN 208  TIBC 192*  IRON 11*  RETICCTPCT 0.7   Urinalysis:  Recent Labs Lab 01/26/15 1452  COLORURINE AMBER*  LABSPEC 1.030  PHURINE 5.0  GLUCOSEU NEGATIVE  HGBUR SMALL*  BILIRUBINUR SMALL*  KETONESUR 15*  PROTEINUR >300*  NITRITE NEGATIVE  LEUKOCYTESUR MODERATE*    Micro Results: Recent Results (from the past 240 hour(s))  Urine culture     Status: None (Preliminary result)   Collection Time: 01/26/15  2:52 PM  Result Value Ref Range Status   Specimen Description URINE, CLEAN CATCH  Final   Special Requests NONE  Final   Culture TOO YOUNG TO READ  Final   Report Status PENDING  Incomplete   Studies/Results: Dg Chest 2 View  01/26/2015  CLINICAL DATA:  Productive cough and fever. EXAM: CHEST - 2 VIEW COMPARISON:  09/05/2014 FINDINGS: The heart size and mediastinal contours are within normal limits. Stable appearance of mitral valve prosthesis. There is no evidence of pulmonary edema, consolidation, pneumothorax, nodule or pleural fluid. The visualized skeletal structures are unremarkable. IMPRESSION: No active disease. Electronically Signed   By: Rudene Anda.D.  On: 01/26/2015 14:46   Medications: I have reviewed the patient's current medications. Scheduled Meds: . sodium chloride   Intravenous Once  . ceFEPime (MAXIPIME) IV  2 g Intravenous Q24H  . mycophenolate  360 mg Oral BID  . [START ON 01/28/2015] predniSONE  5 mg Oral QODAY  . sodium chloride  3 mL Intravenous Q12H  . vancomycin  750 mg Intravenous Q12H   Continuous Infusions: . dextrose 5 % and 0.45% NaCl     PRN Meds:.acetaminophen **OR** acetaminophen, ondansetron **OR** ondansetron (ZOFRAN) IV Assessment/Plan:  Fever: Patient meets 2/4 SIRS criteria with the urinary tract a most  likely source of infection, given many bacteria and 6-30 WBCs on microscopy. She has continued to spike fevers has high as 103 She is immunocompromised with SLE regimen. Another consideration is a lupus flair with renal and hematologic involvement (current AKI, anemia), however he current symptoms are dissimilar to previous lupus flairs she's had. 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 Endocarditis is a consideration given her artificial valve. We will monitor clinical improvement on antibiotics and await blood, urine culture results. Previous urine culture in our system was positive for group B strep - Continue vancomycin and cefepime IV, will narrow based on urine speciation and if she clinically improves in the next 24-48 hours - Blood and urine cultures pending.  - Flu panel negative - HIV ab pending - Tylenol q4h prn - CBC in AM  AKI: Patient had an elevated creatinine at 1.11 in November at an ED visit for facial swelling, it is now 2 and stable from admission. Proteinuria noted on UA. Could be related to a possible SLE flair or hypoperfusion in the setting of possible sepsis. Glomerulonephritis/nephrotic syndrome is also a consideration. - 75 cc/hr D5 1/2 NS  Anemia: Hgb 7.5 down to 6.4 this AM. Transfusion held until fevers under control, and he will need a dose of Tylenol beforehand. No bleeding noted or bruising. Had her period 2 weeks ago. LDH 457 (H), Retic 3% (L), ferritin normal, TIBC (L). Could be related to SLE, hemolytic process, mycophenolate. She reported that her Hgb tends to decrease in the setting of acute illnesses. - haptoglobin pending  SLE: Mycophenolate 360 mg BID, Prednisone 5 mg  Mitral Valve Replacement: Warfarin per pharm. Mitral Valve so goal INR of 2-3.  Dispo: Disposition is deferred at this time, awaiting improvement of current medical problems.  Anticipated discharge in approximately 4-5  day(s).   The patient does have a current PCP Timothy Lasso, MD) and does not need an [facility] hospital follow-up appointment after discharge.  The patient does not have transportation limitations that hinder transportation to clinic appointments.  .Services Needed at time of discharge: Y = Yes, Blank = No PT:   OT:   RN:   Equipment:   Other:     LOS: 1 day   Ruben Im, MD 01/27/2015, 10:04 AM

## 2015-01-27 NOTE — Progress Notes (Signed)
Report received from [place] on [address]. Louie Bun S 7:35 AM

## 2015-01-28 DIAGNOSIS — N390 Urinary tract infection, site not specified: Secondary | ICD-10-CM | POA: Insufficient documentation

## 2015-01-28 LAB — BASIC METABOLIC PANEL
Anion gap: 7 (ref 5–15)
BUN: 19 mg/dL (ref 6–20)
CALCIUM: 7.4 mg/dL — AB (ref 8.9–10.3)
CO2: 16 mmol/L — ABNORMAL LOW (ref 22–32)
CREATININE: 1.55 mg/dL — AB (ref 0.44–1.00)
Chloride: 121 mmol/L — ABNORMAL HIGH (ref 101–111)
GFR calc Af Amer: 55 mL/min — ABNORMAL LOW (ref >60)
GFR, EST NON AFRICAN AMERICAN: 47 mL/min — AB (ref >60)
GLUCOSE: 88 mg/dL (ref 65–99)
Potassium: 4.1 mmol/L (ref 3.5–5.1)
SODIUM: 144 mmol/L (ref 135–145)

## 2015-01-28 LAB — TYPE AND SCREEN
ABO/RH(D): O POS
Antibody Screen: NEGATIVE
Unit division: 0

## 2015-01-28 LAB — CBC
HCT: 24.2 % — ABNORMAL LOW (ref 36.0–46.0)
HEMATOCRIT: 26.1 % — AB (ref 36.0–46.0)
HEMOGLOBIN: 8.3 g/dL — AB (ref 12.0–15.0)
Hemoglobin: 7.8 g/dL — ABNORMAL LOW (ref 12.0–15.0)
MCH: 26.2 pg (ref 26.0–34.0)
MCH: 26.6 pg (ref 26.0–34.0)
MCHC: 31.8 g/dL (ref 30.0–36.0)
MCHC: 32.2 g/dL (ref 30.0–36.0)
MCV: 82.3 fL (ref 78.0–100.0)
MCV: 82.6 fL (ref 78.0–100.0)
PLATELETS: 214 10*3/uL (ref 150–400)
PLATELETS: 225 10*3/uL (ref 150–400)
RBC: 3.17 MIL/uL — AB (ref 3.87–5.11)
RBC: 2.93 MIL/uL — ABNORMAL LOW (ref 3.87–5.11)
RDW: 16.5 % — ABNORMAL HIGH (ref 11.5–15.5)
RDW: 16.7 % — AB (ref 11.5–15.5)
WBC: 10 10*3/uL (ref 4.0–10.5)
WBC: 8.8 10*3/uL (ref 4.0–10.5)

## 2015-01-28 LAB — PROTIME-INR
INR: 2.91 — ABNORMAL HIGH (ref 0.00–1.49)
PROTHROMBIN TIME: 29.9 s — AB (ref 11.6–15.2)

## 2015-01-28 LAB — ANTI-DNA ANTIBODY, DOUBLE-STRANDED: DS DNA AB: 97 [IU]/mL — AB (ref 0–9)

## 2015-01-28 LAB — C3 COMPLEMENT: C3 COMPLEMENT: 66 mg/dL — AB (ref 82–167)

## 2015-01-28 LAB — C4 COMPLEMENT: COMPLEMENT C4, BODY FLUID: 11 mg/dL — AB (ref 14–44)

## 2015-01-28 LAB — COMPLEMENT, TOTAL: Compl, Total (CH50): 27 U/mL — ABNORMAL LOW (ref 42–60)

## 2015-01-28 MED ORDER — WARFARIN SODIUM 5 MG PO TABS
5.0000 mg | ORAL_TABLET | Freq: Once | ORAL | Status: AC
  Administered 2015-01-28: 5 mg via ORAL
  Filled 2015-01-28: qty 1

## 2015-01-28 MED ORDER — DEXTROSE 5 % IV SOLN
2.0000 g | Freq: Two times a day (BID) | INTRAVENOUS | Status: AC
  Administered 2015-01-28 – 2015-01-30 (×6): 2 g via INTRAVENOUS
  Filled 2015-01-28 (×7): qty 2

## 2015-01-28 NOTE — Progress Notes (Signed)
Pharmacy Antibiotic Follow-up Note  Joan Miles is a 20 y.o. year-old female admitted on 01/26/2015.  The patient is currently on day #3 of Vancomycin and Cefepime for r/o sepsis.  Assessment: Pt continues on broad-spectrum antibiotics. Renal function has improved from admission. Tmax is 102.7 and WBC is WNL.  Plan: - Change cefepime to 2gm IV Q12H - Continue vancomycin 750mg  IV Q12H - F/u renal fxn, C&S, clinical status and trough at [facility] - Consider de-escalating therapy  Temp (24hrs), Avg:99.4 F (37.4 C), Min:98.6 F (37 C), Max:102.7 F (39.3 C)   Recent Labs Lab 01/26/15 1324 01/27/15 0513 01/27/15 2300 01/28/15 0255  WBC 11.2* 10.2 10.0 8.8     Recent Labs Lab 01/26/15 1324 01/27/15 0513 01/28/15 0255  CREATININE 2.02* 1.92* 1.55*   Estimated Creatinine Clearance: 57.1 mL/min (by C-G formula based on Cr of 1.55).    Allergies  Allergen Reactions  . Ciprofloxacin Hives    Antimicrobials this admission: Vanc 1/1 >> Cefepime 1/1 >>  Levels/dose changes this admission: Cefepime changed to 2gm Q12H 1/3  Microbiology results: 1/1 Blood - NGTD 1/2 MRSA - NEG 1/1 Urine - multiple species  Thank you for allowing pharmacy to be a part of this patient's care.  03/28/15, PharmD, BCPS Pager # [phone] 01/28/2015 8:25 AM

## 2015-01-28 NOTE — Progress Notes (Signed)
ANTICOAGULATION CONSULT NOTE - Follow Up Consult  Pharmacy Consult for Warfarin Indication: mechanical mitral valve  Allergies  Allergen Reactions  . Ciprofloxacin Hives    Patient Measurements: Height: 5\' 2"  (157.5 cm) Weight: 179 lb 0.2 oz (81.2 kg) IBW/kg (Calculated) : 50.1  Vital Signs: Temp: 99.3 F (37.4 C) (01/03 0811) Temp Source: Oral (01/03 0811) BP: 125/93 mmHg (01/03 0400) Pulse Rate: 88 (01/03 0700)  Labs:  Recent Labs  01/26/15 1324 01/26/15 1651 01/27/15 0513 01/27/15 2300 01/28/15 0255  HGB 7.5*  --  6.4* 8.3* 7.8*  HCT 25.2*  --  21.1* 26.1* 24.2*  PLT 245  --  203 214 225  LABPROT  --  18.4* 23.4*  --  29.9*  INR  --  1.53* 2.10*  --  2.91*  CREATININE 2.02*  --  1.92*  --  1.55*    Estimated Creatinine Clearance: 57.1 mL/min (by C-G formula based on Cr of 1.55).  Medications:  Coumadin 7.5mg  daily   Assessment: 20 year old female presenting with possible sepsis. 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 Her INR is now therapeutic at 2.91 but increased very fast. Hgb improved to 7.8 and platelets are WNL. No overt bleeding noted.   Goal of Therapy:  INR 2.5-3.5   Plan:  - Reduce warfarin to 5mg  PO x 1 tonight - Daily INR  26, PharmD, BCPS Pager # [phone] 01/28/2015 8:23 AM

## 2015-01-28 NOTE — Progress Notes (Signed)
Subjective:  [name] says she feels "much better" today. Her lip swelling has resolved. She did have a recorded temperature of 102.7 at midnight, but she denies any chills or night sweats. She denies any new rashes, diarrhea, or abdominal pain overnight. She denies any bleeding.  Objective: Vital signs in last 24 hours: Filed Vitals:   01/28/15 0600 01/28/15 0700 01/28/15 0800 01/28/15 0811  BP:   126/87   Pulse: 85 88 92   Temp:    99.3 F (37.4 C)  TempSrc:    Oral  Resp: 17 18 15    Height:      Weight:      SpO2: 100% 100% 100%    Weight change:   Intake/Output Summary (Last 24 hours) at 01/28/15 0900 Last data filed at 01/28/15 0800  Gross per 24 hour  Intake   2501 ml  Output   1850 ml  Net    651 ml   Physical exam: General: Lying in bed, no acute distress. Not diahoretic HEENT: Lip swelling resolved, no peri-orbital or lingual swelling. Moist mucous membranes Cardiovascular: RRR. No mumurs, rubs, or gallops Pulmonary: Clear to ausculation bilaterally. Good air movement bilaterally Abdominal: Soft, non-tender, non-distended. Stool was light without blood. Extremities: No clubbing, cyanosis or edema Skin: No rashes. Warm and dry. Psychiatric: Cheerful affect. Normal behavior.  Lab Results: Basic Metabolic Panel:  Recent Labs Lab 01/27/15 0513 01/28/15 0255  NA 143 144  K 4.1 4.1  CL 119* 121*  CO2 15* 16*  GLUCOSE 104* 88  BUN 29* 19  CREATININE 1.92* 1.55*  CALCIUM 7.2* 7.4*   Liver Function Tests:  Recent Labs Lab 01/26/15 1324  AST 22  ALT 13*  ALKPHOS 55  BILITOT 0.4  PROT 7.1  ALBUMIN 2.5*  CBC:  Recent Labs Lab 01/26/15 1324  01/27/15 2300 01/28/15 0255  WBC 11.2*  < > 10.0 8.8  NEUTROABS 9.2*  --   --   --   HGB 7.5*  < > 8.3* 7.8*  HCT 25.2*  < > 26.1* 24.2*  MCV 84.0  < > 82.3 82.6  PLT 245  < > 214 225  < > = values in this interval not displayed. Coagulation:  Recent Labs Lab 01/26/15 1651 01/27/15 0513  01/28/15 0255  LABPROT 18.4* 23.4* 29.9*  INR 1.53* 2.10* 2.91*   Anemia Panel:  Recent Labs Lab 01/26/15 1914  FERRITIN 208  TIBC 192*  IRON 11*  RETICCTPCT 0.7   Urinalysis:  Recent Labs Lab 01/26/15 1452  COLORURINE AMBER*  LABSPEC 1.030  PHURINE 5.0  GLUCOSEU NEGATIVE  HGBUR SMALL*  BILIRUBINUR SMALL*  KETONESUR 15*  PROTEINUR >300*  NITRITE NEGATIVE  LEUKOCYTESUR MODERATE*    Micro Results: Recent Results (from the past 240 hour(s))  Culture, blood (routine x 2)     Status: None (Preliminary result)   Collection Time: 01/26/15  1:14 PM  Result Value Ref Range Status   Specimen Description BLOOD RIGHT ANTECUBITAL  Final   Special Requests BOTTLES DRAWN AEROBIC AND ANAEROBIC 10CC  Final   Culture NO GROWTH < 24 HOURS  Final   Report Status PENDING  Incomplete  Urine culture     Status: None   Collection Time: 01/26/15  2:52 PM  Result Value Ref Range Status   Specimen Description URINE, CLEAN CATCH  Final   Special Requests NONE  Final   Culture MULTIPLE SPECIES PRESENT, SUGGEST RECOLLECTION  Final   Report Status 01/27/2015 FINAL  Final  Culture,  blood (routine x 2)     Status: None (Preliminary result)   Collection Time: 01/26/15  7:01 PM  Result Value Ref Range Status   Specimen Description BLOOD RIGHT HAND  Final   Special Requests IN PEDIATRIC BOTTLE 2CC  Final   Culture NO GROWTH < 24 HOURS  Final   Report Status PENDING  Incomplete  MRSA PCR Screening     Status: None   Collection Time: 01/27/15  9:30 AM  Result Value Ref Range Status   MRSA by PCR NEGATIVE NEGATIVE Final    Comment:        The GeneXpert MRSA Assay (FDA approved for NASAL specimens only), is one component of a comprehensive MRSA colonization surveillance program. It is not intended to diagnose MRSA infection nor to guide or monitor treatment for MRSA infections.    Studies/Results: Dg Chest 2 View  01/26/2015  CLINICAL DATA:  Productive cough and fever. EXAM: CHEST - 2  VIEW COMPARISON:  09/05/2014 FINDINGS: The heart size and mediastinal contours are within normal limits. Stable appearance of mitral valve prosthesis. There is no evidence of pulmonary edema, consolidation, pneumothorax, nodule or pleural fluid. The visualized skeletal structures are unremarkable. IMPRESSION: No active disease. Electronically Signed   By: Irish Lack M.D.   On: 01/26/2015 14:46   Medications: I have reviewed the patient's current medications. Scheduled Meds: . sodium chloride   Intravenous Once  . ceFEPime (MAXIPIME) IV  2 g Intravenous Q12H  . mycophenolate  360 mg Oral BID  . predniSONE  5 mg Oral QODAY  . sodium chloride  3 mL Intravenous Q12H  . vancomycin  750 mg Intravenous Q12H  . warfarin  5 mg Oral ONCE-1800  . Warfarin - Pharmacist Dosing Inpatient   Does not apply q1800   Continuous Infusions: . dextrose 5 % and 0.45% NaCl 75 mL/hr at 01/28/15 0800   PRN Meds:.acetaminophen **OR** acetaminophen, ondansetron **OR** ondansetron (ZOFRAN) IV Assessment/Plan:  Fever: Patient meets 2/4 SIRS criteria with the urinary tract a most likely source of infection, given many bacteria and 6-30 WBCs on microscopy. She has continued to spike fevers has high as 103 She is immunocompromised with SLE regimen. Another consideration is a lupus flair with renal and hematologic involvement (current AKI, anemia), however he current symptoms are dissimilar to previous lupus flairs she's had. 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 Endocarditis is a consideration given her artificial valve. We will monitor clinical improvement on antibiotics and await blood, urine culture results. Previous urine culture in our system was positive for group B strep.  She has clinically improved, but given her immunosuppressed status on Cellcept, history of SLE, and mechanical valve, lupus flair and an occult infection are still  considerations at this point. Blood cultures no growth <24 hours. Urine cultures showed multiple species. C3/C4 are low (C3 66, C4 11). In 02/2014, C3 113, C4 22; Anti-DS-DNA 492 in 02/2014.  - Vancomycin IV, Cefepime IV - Tylenol q4h prn - CBC in AM - Anti DS-DNA pending - Transfer to Telemetry bed  AKI: Patient had an elevated creatinine at 1.11 in November at an ED visit for facial swelling, and was 2 on admission. Has improved to 1.55 on IVF. Proteinuria noted on UA with elevated protein/creatinine ratio (2.44). Could be related to a possible SLE flair, but renal function is improving without additional steroids. Could be a superimposed pre-renal component in setting of sepsis. - 75 cc/hr D5  1/2 NS  Non-AG Metabolic Acidosis: Bicarb of 16 today. Patient had mild diarrhea before admission but was not severe. 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 - BMET tomorrow  Anemia: Anemic to 6.4 which improved to 8.3 post-transfusion and is 7.8 this AM. No bleeding noted or bruising. No bleeding in stool. Had her period 2 weeks ago. LDH 457 (H) but haptoglobin normal, Retic 3% (L), ferritin normal, TIBC (L). No schistocytes seen on smear after review by myself and technologist. She reported that her Hgb tends to decrease in the setting of acute illnesses. Mycophenolate could also be a contributor.  - Goal Hgb of 7 or above  SLE: Mycophenolate 360 mg BID, Prednisone 5 mg  Mitral Valve Replacement: Warfarin per pharm. Goal INR of 2.5-3.5.  Dispo: Disposition is deferred at this time, awaiting improvement of current medical problems.  Anticipated discharge in approximately 4-5 day(s).   The patient does have a current PCP Timothy Lasso, MD) and does not need an [facility] hospital follow-up appointment after discharge.  The patient does not have transportation limitations that hinder transportation to clinic appointments.  .Services Needed at time of discharge: Y = Yes, Blank =  No PT:   OT:   RN:   Equipment:   Other:     LOS: 2 days   Ruben Im, MD 01/28/2015, 9:00 AM

## 2015-01-28 NOTE — Progress Notes (Signed)
Report received from RN on 2 south. Pt to be transferred to 5 west room 19.

## 2015-01-28 NOTE — Progress Notes (Signed)
NURSING PROGRESS NOTE  Joan Miles [id number] Transfer Data: 01/28/2015 3:31 PM Attending Provider: Tyson Alias, MD [id number], CONSUELO, MD Code Status: Full   Joan Miles is a 20 y.o. female patient transferred from 2 south  -No acute distress noted.  -No complaints of shortness of breath.  -No complaints of chest pain.   Cardiac Monitoring: Box # 10 in place. Cardiac monitor yields:normal sinus rhythm.  Last Documented Vital Signs: Blood pressure 130/81, pulse 103, temperature 99.6 F (37.6 C), temperature source Oral, resp. rate 20, height 5\' 2"  (1.575 m), weight 81.2 kg (179 lb 0.2 oz), last menstrual period 01/08/2015, SpO2 100 %.  IV Fluids:  IV in place, occlusive dsg intact without redness, IV cath forearm left, condition patent and no redness D5W/0.45 NaCl.   Allergies:  Ciprofloxacin  Past Medical History:   has a past medical history of Lupus (HCC); Mitral valve stenosis; Hypertension; and Renal insufficiency.  Past Surgical History:   has past surgical history that includes Value Replacement; laparoscopic appendectomy (N/A, 02/11/2013); Laparoscopic salpingo oophorectomy (Right, 02/11/2013); Tonsillectomy; Appendectomy (January 2015); Esophagogastroduodenoscopy (N/A, 02/14/2013); and Cardiac surgery.  Social History:   reports that she has never smoked. She has never used smokeless tobacco. She reports that she does not drink alcohol or use illicit drugs.  Skin: intact except where otherwise charted  Patient/Family orientated to room. Information packet given to patient/family. Admission inpatient armband information verified with patient/family to include name and date of birth and placed on patient arm. Side rails up x 2, fall assessment and education completed with patient/family. Patient/family able to verbalize understanding of risk associated with falls and verbalized understanding to call for assistance before getting out of bed. Call light within  reach. Patient/family able to voice and demonstrate understanding of unit orientation instructions.

## 2015-01-29 LAB — CBC
HCT: 28.1 % — ABNORMAL LOW (ref 36.0–46.0)
Hemoglobin: 8.8 g/dL — ABNORMAL LOW (ref 12.0–15.0)
MCH: 26.1 pg (ref 26.0–34.0)
MCHC: 31.3 g/dL (ref 30.0–36.0)
MCV: 83.4 fL (ref 78.0–100.0)
PLATELETS: 227 10*3/uL (ref 150–400)
RBC: 3.37 MIL/uL — ABNORMAL LOW (ref 3.87–5.11)
RDW: 17 % — AB (ref 11.5–15.5)
WBC: 9.2 10*3/uL (ref 4.0–10.5)

## 2015-01-29 LAB — PROTIME-INR
INR: 4.31 — AB (ref 0.00–1.49)
Prothrombin Time: 40.2 seconds — ABNORMAL HIGH (ref 11.6–15.2)

## 2015-01-29 LAB — BASIC METABOLIC PANEL
ANION GAP: 9 (ref 5–15)
BUN: 13 mg/dL (ref 6–20)
CALCIUM: 7.7 mg/dL — AB (ref 8.9–10.3)
CO2: 14 mmol/L — ABNORMAL LOW (ref 22–32)
Chloride: 123 mmol/L — ABNORMAL HIGH (ref 101–111)
Creatinine, Ser: 1.41 mg/dL — ABNORMAL HIGH (ref 0.44–1.00)
GFR calc Af Amer: >60 mL/min (ref >60)
GFR, EST NON AFRICAN AMERICAN: 53 mL/min — AB (ref >60)
GLUCOSE: 87 mg/dL (ref 65–99)
Potassium: 4 mmol/L (ref 3.5–5.1)
Sodium: 146 mmol/L — ABNORMAL HIGH (ref 135–145)

## 2015-01-29 NOTE — Discharge Instructions (Addendum)
Joan Miles, it was an absolute joy taking care of you in the hospital. We are sending you home to complete an antibiotic therapy. You will take Cefdinir 300 mg twice a day, with your last dose being on 02/10/2015. Should you feel your fevers returning, specifically for a temperature over 100.4, please seek immediate medical attention. If you experience chest pain, shortness of breath, burning on urination, chills, or night sweats, please seek medical attention. Please be careful in the snow.   I encourage you to follow up with your doctors at [facility]. I see you have an appointment with [facility], which you should keep.   Information on my medicine - Coumadin   (Warfarin)  This medication education was reviewed with me or my healthcare representative as part of my discharge preparation.  The pharmacist that spoke with me during my hospital stay was:  Ancil Boozer, RPH  Why was Coumadin prescribed for you? Coumadin was prescribed for you because you have a blood clot or a medical condition that can cause an increased risk of forming blood clots. Blood clots can cause serious health problems by blocking the flow of blood to the heart, lung, or brain. Coumadin can prevent harmful blood clots from forming. As a reminder your indication for Coumadin is:   Blood Clot Prevention After Heart Valve Surgery  What test will check on my response to Coumadin? While on Coumadin (warfarin) you will need to have an INR test regularly to ensure that your dose is keeping you in the desired range. The INR (international normalized ratio) number is calculated from the result of the laboratory test called prothrombin time (PT).  If an INR APPOINTMENT HAS NOT ALREADY BEEN MADE FOR YOU please schedule an appointment to have this lab work done by your health care provider within 7 days. Your INR goal is usually a number between:  2 to 3 or your provider may give you a more narrow range like 2-2.5.  Ask  your health care provider during an office visit what your goal INR is.  What  do you need to  know  About  COUMADIN? Take Coumadin (warfarin) exactly as prescribed by your healthcare provider about the same time each day.  DO NOT stop taking without talking to the doctor who prescribed the medication.  Stopping without other blood clot prevention medication to take the place of Coumadin may increase your risk of developing a new clot or stroke.  Get refills before you run out.  What do you do if you miss a dose? If you miss a dose, take it as soon as you remember on the same day then continue your regularly scheduled regimen the next day.  Do not take two doses of Coumadin at the same time.  Important Safety Information A possible side effect of Coumadin (Warfarin) is an increased risk of bleeding. You should call your healthcare provider right away if you experience any of the following: ? Bleeding from an injury or your nose that does not stop. ? Unusual colored urine (red or dark brown) or unusual colored stools (red or black). ? Unusual bruising for unknown reasons. ? A serious fall or if you hit your head (even if there is no bleeding).  Some foods or medicines interact with Coumadin (warfarin) and might alter your response to warfarin. To help avoid this: ? Eat a balanced diet, maintaining a consistent amount of Vitamin K. ? Notify your provider about major diet changes you  plan to make. ? Avoid alcohol or limit your intake to 1 drink for women and 2 drinks for men per day. (1 drink is 5 oz. wine, 12 oz. beer, or 1.5 oz. liquor.)  Make sure that ANY health care provider who prescribes medication for you knows that you are taking Coumadin (warfarin).  Also make sure the healthcare provider who is monitoring your Coumadin knows when you have started a new medication including herbals and non-prescription products.  Coumadin (Warfarin)  Major Drug Interactions  Increased Warfarin Effect  Decreased Warfarin Effect  Alcohol (large quantities) Antibiotics (esp. Septra/Bactrim, Flagyl, Cipro) Amiodarone (Cordarone) Aspirin (ASA) Cimetidine (Tagamet) Megestrol (Megace) NSAIDs (ibuprofen, naproxen, etc.) Piroxicam (Feldene) Propafenone (Rythmol SR) Propranolol (Inderal) Isoniazid (INH) Posaconazole (Noxafil) Barbiturates (Phenobarbital) Carbamazepine (Tegretol) Chlordiazepoxide (Librium) Cholestyramine (Questran) Griseofulvin Oral Contraceptives Rifampin Sucralfate (Carafate) Vitamin K   Coumadin (Warfarin) Major Herbal Interactions  Increased Warfarin Effect Decreased Warfarin Effect  Garlic Ginseng Ginkgo biloba Coenzyme Q10 Green tea St. Johns wort    Coumadin (Warfarin) FOOD Interactions  Eat a consistent number of servings per week of foods HIGH in Vitamin K (1 serving =  cup)  Collards (cooked, or boiled & drained) Kale (cooked, or boiled & drained) Mustard greens (cooked, or boiled & drained) Parsley *serving size only =  cup Spinach (cooked, or boiled & drained) Swiss chard (cooked, or boiled & drained) Turnip greens (cooked, or boiled & drained)  Eat a consistent number of servings per week of foods MEDIUM-HIGH in Vitamin K (1 serving = 1 cup)  Asparagus (cooked, or boiled & drained) Broccoli (cooked, boiled & drained, or raw & chopped) Brussel sprouts (cooked, or boiled & drained) *serving size only =  cup Lettuce, raw (green leaf, endive, romaine) Spinach, raw Turnip greens, raw & chopped   These websites have more information on Coumadin (warfarin):  http://www.king-russell.com/; https://www.hines.net/;

## 2015-01-29 NOTE — Progress Notes (Signed)
Subjective:  [name] said she feels fine today without new complaints. She denies any fever, chills, cough, or dysuria. She denies any chest pain. She said, "I feel well enough to walk out of the hospital." She had a recorded temperature of 101.5 overnight.   Objective: Vital signs in last 24 hours: Filed Vitals:   01/28/15 1526 01/28/15 2200 01/29/15 0117 01/29/15 0644  BP: 130/81 137/87  139/92  Pulse: 103 93  83  Temp: 99.6 F (37.6 C) 101.5 F (38.6 C) 98.4 F (36.9 C) 98.7 F (37.1 C)  TempSrc: Oral Oral Oral Oral  Resp: _0 Height:      Weight:      SpO2: 100% 100%  100%   Weight change:   Intake/Output Summary (Last 24 hours) at 01/29/15 1121 Last data filed at 01/28/15 2217  Gross per 24 hour  Intake    450 ml  Output    400 ml  Net     50 ml   Physical exam: General: Lying in bed, no acute distress. Not diahoretic HEENT: Lip swelling resolved, no peri-orbital or lingual swelling. Moist mucous membranes Cardiovascular: RRR. No mumurs, rubs, or gallops Pulmonary: Clear to ausculation bilaterally. Good air movement bilaterally Abdominal: Soft, non-tender, non-distended. Stool was light without blood. Extremities: No clubbing, cyanosis or edema Skin: No rashes. Warm and dry. Psychiatric: Cheerful affect. Normal behavior.  Lab Results: Basic Metabolic Panel:  Recent Labs Lab 01/28/15 0255 01/29/15 0641  NA 144 146*  K 4.1 4.0  CL 121* 123*  CO2 16* 14*  GLUCOSE 88 87  BUN 19 13  CREATININE 1.55* 1.41*  CALCIUM 7.4* 7.7*   Liver Function Tests:  Recent Labs Lab 01/26/15 1324  AST 22  ALT 13*  ALKPHOS 55  BILITOT 0.4  PROT 7.1  ALBUMIN 2.5*  CBC:  Recent Labs Lab 01/26/15 1324  01/28/15 0255 01/29/15 0641  WBC 11.2*  < > 8.8 9.2  NEUTROABS 9.2*  --   --   --   HGB 7.5*  < > 7.8* 8.8*  HCT 25.2*  < > 24.2* 28.1*  MCV 84.0  < > 82.6 83.4  PLT 245  < > 225 227  < > = values in this interval not  displayed. Coagulation:  Recent Labs Lab 01/26/15 1651 01/27/15 0513 01/28/15 0255 01/29/15 0641  LABPROT 18.4* 23.4* 29.9* 40.2*  INR 1.53* 2.10* 2.91* 4.31*    Micro Results: Recent Results (from the past 240 hour(s))  Culture, blood (routine x 2)     Status: None (Preliminary result)   Collection Time: 01/26/15  1:14 PM  Result Value Ref Range Status   Specimen Description BLOOD RIGHT ANTECUBITAL  Final   Special Requests BOTTLES DRAWN AEROBIC AND ANAEROBIC 10CC  Final   Culture NO GROWTH 2 DAYS  Final   Report Status PENDING  Incomplete  Urine culture     Status: None   Collection Time: 01/26/15  2:52 PM  Result Value Ref Range Status   Specimen Description URINE, CLEAN CATCH  Final   Special Requests NONE  Final   Culture MULTIPLE SPECIES PRESENT, SUGGEST RECOLLECTION  Final   Report Status 01/27/2015 FINAL  Final  Culture, blood (routine x 2)     Status: None (Preliminary result)   Collection Time: 01/26/15  7:01 PM  Result Value Ref Range Status   Specimen Description BLOOD RIGHT HAND  Final   Special Requests IN PEDIATRIC BOTTLE [facility]  Final  Culture NO GROWTH 2 DAYS  Final   Report Status PENDING  Incomplete  MRSA PCR Screening     Status: None   Collection Time: 01/27/15  9:30 AM  Result Value Ref Range Status   MRSA by PCR NEGATIVE NEGATIVE Final    Comment:        The GeneXpert MRSA Assay (FDA approved for NASAL specimens only), is one component of a comprehensive MRSA colonization surveillance program. It is not intended to diagnose MRSA infection nor to guide or monitor treatment for MRSA infections.    Studies/Results: No results found. Medications: I have reviewed the patient's current medications. Scheduled Meds: . ceFEPime (MAXIPIME) IV  2 g Intravenous Q12H  . mycophenolate  360 mg Oral BID  . predniSONE  5 mg Oral QODAY  . sodium chloride  3 mL Intravenous Q12H  . Warfarin - Pharmacist Dosing Inpatient   Does not apply q1800    Continuous Infusions: . dextrose 5 % and 0.45% NaCl 75 mL/hr at 01/28/15 1849   PRN Meds:.acetaminophen **OR** acetaminophen, ondansetron **OR** ondansetron (ZOFRAN) IV Assessment/Plan:  Fever: Patient met 2/4 SIRS criteria with the urinary tract a most likely source of infection, given many bacteria and 6-30 WBCs on microscopy. However, she denies ever having dysuria, and urine culture showed multiple species. Past pertinent history includes tubo-ovarian abscess. She has continued to improve significantly, with fever overnight to 101.5 without subjective signs. Blood cultures have been negative. If she spikes another fever today, we will try to obtain a TEE to query endocarditis. Lupus flare seems less likely at this point given her ds-DNA is around lowest levels this year based on [facility] clinic notes in Care everywhere. Moreover, we have not treated her for a flare and she has clinically improved. - Cefepime IV, will complete tomorrow for total 5 day course - Tylenol q4h prn - CBC in AM  AKI: Patient had an elevated creatinine at 1.11 in November at an ED visit for facial swelling, and was 2 on admission. Has improved to 1.41 on IVF. Proteinuria noted on UA with elevated protein/creatinine ratio (2.44). Could be related to a possible SLE flare, but renal function is improving without additional steroids. Likely pre-renal etiology - 75 cc/hr D5 1/2 NS  Non-AG Metabolic Acidosis: Bicarb of 16 today. Patient had mild diarrhea before admission but was not severe. 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 - BMET tomorrow  Anemia: Anemic to 6.4 which improved to 8.3 post-transfusion and is now 8.8 today. Hemolytic etiology less likely given normal haptoglobin and normal peripheral smear. She reported that her Hgb tends to decrease in the setting of acute illnesses. Mycophenolate could also be a contributor.  - Goal Hgb of 7 or above  SLE: Mycophenolate 360 mg BID, Prednisone 5  mg  Mitral Valve Replacement: INR elevated 4.31 today. Warfarin per pharm. Goal INR of 2.5-3.5.  Dispo: Disposition is deferred at this time, awaiting improvement of current medical problems.  Anticipated discharge in approximately 4-5 day(s).   The patient does have a current PCP Lovina Reach, MD) and does not need an [facility] hospital follow-up appointment after discharge.  The patient does not have transportation limitations that hinder transportation to clinic appointments.  .Services Needed at time of discharge: Y = Yes, Blank = No PT:   OT:   RN:   Equipment:   Other:     LOS: 3 days   Liberty Handy, MD 01/29/2015, 11:21 AM

## 2015-01-29 NOTE — Progress Notes (Signed)
ANTICOAGULATION CONSULT NOTE - Follow Up Consult  Pharmacy Consult for Warfarin Indication: mechanical mitral valve  Allergies  Allergen Reactions  . Ciprofloxacin Hives    Patient Measurements: Height: 5\' 2"  (157.5 cm) Weight: 179 lb 0.2 oz (81.2 kg) IBW/kg (Calculated) : 50.1  Vital Signs: Temp: 98.7 F (37.1 C) (01/04 0644) Temp Source: Oral (01/04 0644) BP: 139/92 mmHg (01/04 0644) Pulse Rate: 83 (01/04 0644)  Labs:  Recent Labs  01/27/15 0513 01/27/15 2300 01/28/15 0255 01/29/15 0641  HGB 6.4* 8.3* 7.8* 8.8*  HCT 21.1* 26.1* 24.2* 28.1*  PLT 203 214 225 227  LABPROT 23.4*  --  29.9* 40.2*  INR 2.10*  --  2.91* 4.31*  CREATININE 1.92*  --  1.55* 1.41*    Estimated Creatinine Clearance: 62.8 mL/min (by C-G formula based on Cr of 1.41).  Medications:  Coumadin 7.5mg  daily PTA  Assessment: 20 year old female presenting with possible sepsis. 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 Her INR is rapidly increasing despite lower doses of Coumadin ordered.  INR today 4.31. Hgb improved to 8.8 after transfusion and platelets are WNL. No overt bleeding noted.   Goal of Therapy:  INR 2.5-3.5   Plan:  - No Warfarin tonight - Daily INR  26, Pharm.D., BCPS Clinical Pharmacist Pager [phone] 01/29/2015 11:53 AM

## 2015-01-29 NOTE — Care Management Note (Signed)
Case Management Note  Patient Details  Name: Joan Miles MRN: [id number] Date of Birth: August 05, 1995  Subjective/Objective:                  Date-01-29-15 Initial Assessment Spoke with patient at the bedside.  Introduced self as Sports coach and explained role in discharge planning and how to be reached.  Verified patient lives in [place] with parents  Verified patient anticipates to go home with family at time of discharge   Patient has no DME Expressed potential need for no other DME.  Patient denied  needing help with their medication. Patient is in process of getting on parents insurance Patient  is driven by parent to MD appointments.  Verified patient has PCP [place].  Plan: CM will continue to follow for discharge planning and [facility] resources.   Lawerance Sabal RN BSN CM [phone]   Action/Plan:  No CM needs at this time, will continue to follow as patient progresses toward discharge  Expected Discharge Date:  01/28/15               Expected Discharge Plan:  Home/Self Care  In-House Referral:     Discharge planning Services  CM Consult  Post Acute Care Choice:    Choice offered to:     DME Arranged:    DME Agency:     HH Arranged:    HH Agency:     Status of Service:  In process, will continue to follow  Medicare Important Message Given:    Date Medicare IM Given:    Medicare IM give by:    Date Additional Medicare IM Given:    Additional Medicare Important Message give by:     If discussed at Long Length of Stay Meetings, dates discussed:    Additional Comments:  Lawerance Sabal, RN 01/29/2015, 3:32 PM

## 2015-01-30 DIAGNOSIS — D649 Anemia, unspecified: Secondary | ICD-10-CM

## 2015-01-30 DIAGNOSIS — M329 Systemic lupus erythematosus, unspecified: Secondary | ICD-10-CM

## 2015-01-30 DIAGNOSIS — Z7901 Long term (current) use of anticoagulants: Secondary | ICD-10-CM

## 2015-01-30 DIAGNOSIS — E872 Acidosis: Secondary | ICD-10-CM

## 2015-01-30 DIAGNOSIS — Z954 Presence of other heart-valve replacement: Secondary | ICD-10-CM

## 2015-01-30 LAB — PROTIME-INR
INR: 4.24 — ABNORMAL HIGH (ref 0.00–1.49)
PROTHROMBIN TIME: 39.7 s — AB (ref 11.6–15.2)

## 2015-01-30 LAB — BASIC METABOLIC PANEL
Anion gap: 5 (ref 5–15)
BUN: 10 mg/dL (ref 6–20)
CHLORIDE: 121 mmol/L — AB (ref 101–111)
CO2: 17 mmol/L — ABNORMAL LOW (ref 22–32)
Calcium: 7.8 mg/dL — ABNORMAL LOW (ref 8.9–10.3)
Creatinine, Ser: 1.38 mg/dL — ABNORMAL HIGH (ref 0.44–1.00)
GFR calc Af Amer: >60 mL/min (ref >60)
GFR, EST NON AFRICAN AMERICAN: 55 mL/min — AB (ref >60)
Glucose, Bld: 86 mg/dL (ref 65–99)
POTASSIUM: 3.9 mmol/L (ref 3.5–5.1)
SODIUM: 143 mmol/L (ref 135–145)

## 2015-01-30 LAB — CBC
HCT: 23.9 % — ABNORMAL LOW (ref 36.0–46.0)
HEMOGLOBIN: 7.5 g/dL — AB (ref 12.0–15.0)
MCH: 26 pg (ref 26.0–34.0)
MCHC: 31.4 g/dL (ref 30.0–36.0)
MCV: 82.7 fL (ref 78.0–100.0)
PLATELETS: 246 10*3/uL (ref 150–400)
RBC: 2.89 MIL/uL — AB (ref 3.87–5.11)
RDW: 16.7 % — ABNORMAL HIGH (ref 11.5–15.5)
WBC: 6.7 10*3/uL (ref 4.0–10.5)

## 2015-01-30 NOTE — Progress Notes (Signed)
ANTICOAGULATION CONSULT NOTE - Follow Up Consult  Pharmacy Consult for Warfarin Indication: mechanical mitral valve  Allergies  Allergen Reactions  . Ciprofloxacin Hives    Patient Measurements: Height: 5\' 2"  (157.5 cm) Weight: 179 lb 0.2 oz (81.2 kg) IBW/kg (Calculated) : 50.1  Vital Signs: Temp: 99.5 F (37.5 C) (01/05 0547) Temp Source: Oral (01/05 0547) BP: 129/82 mmHg (01/05 0547) Pulse Rate: 88 (01/05 0547)  Labs:  Recent Labs  01/28/15 0255 01/29/15 0641 01/30/15 0609  HGB 7.8* 8.8* 7.5*  HCT 24.2* 28.1* 23.9*  PLT 225 227 246  LABPROT 29.9* 40.2* 39.7*  INR 2.91* 4.31* 4.24*  CREATININE 1.55* 1.41* 1.38*    Estimated Creatinine Clearance: 64.2 mL/min (by C-G formula based on Cr of 1.38).  Medications:  Coumadin 7.5mg  daily PTA  Assessment: 20 year old female presenting with possible sepsis. 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 Her INR remains elevated despite lower doses / holding doses of Coumadin ordered.  INR today 4.24. Hgb 7.5, was 8.8 after transfusion.  No bleeding noted.  Platelets are WNL.   Goal of Therapy:  INR 2.5-3.5   Plan:  - No Warfarin tonight - Daily INR  26, Pharm.D., BCPS Clinical Pharmacist Pager [phone] 01/30/2015 11:45 AM

## 2015-01-30 NOTE — Progress Notes (Signed)
Subjective:  [name] said she feels fine today without new complaints. She denies any fever, chills, cough, or dysuria. She denies any chest pain. She had no recorded fevers overnight. No bleeding episodes.  Objective: Vital signs in last 24 hours: Filed Vitals:   01/29/15 0644 01/29/15 1444 01/29/15 2204 01/30/15 0547  BP: 139/92 148/88 147/81 129/82  Pulse: 83 96 100 88  Temp: 98.7 F (37.1 C)  99.8 F (37.7 C) 99.5 F (37.5 C)  TempSrc: Oral  Oral Oral  Resp: _0 Height:      Weight:      SpO2: 100% 100% 100% 100%   Weight change:   Intake/Output Summary (Last 24 hours) at 01/30/15 0817 Last data filed at 01/29/15 2224  Gross per 24 hour  Intake   2400 ml  Output      0 ml  Net   2400 ml   Physical exam: General: Lying in bed, no acute distress. Not diahoretic HEENT: No scleral icterus. Moist mucous membranes Cardiovascular: RRR. No mumurs, rubs, or gallops Pulmonary: Clear to ausculation bilaterally. Good air movement bilaterally Abdominal: Soft, non-tender, non-distended. Extremities: No clubbing, cyanosis or edema Skin: No rashes. Warm and dry. Psychiatric: Cheerful affect. Normal behavior.  Lab Results: Basic Metabolic Panel:  Recent Labs Lab 01/28/15 0255 01/29/15 0641  NA 144 146*  K 4.1 4.0  CL 121* 123*  CO2 16* 14*  GLUCOSE 88 87  BUN 19 13  CREATININE 1.55* 1.41*  CALCIUM 7.4* 7.7*   Liver Function Tests:  Recent Labs Lab 01/26/15 1324  AST 22  ALT 13*  ALKPHOS 55  BILITOT 0.4  PROT 7.1  ALBUMIN 2.5*  CBC:  Recent Labs Lab 01/26/15 1324  01/29/15 0641 01/30/15 0609  WBC 11.2*  < > 9.2 6.7  NEUTROABS 9.2*  --   --   --   HGB 7.5*  < > 8.8* 7.5*  HCT 25.2*  < > 28.1* 23.9*  MCV 84.0  < > 83.4 82.7  PLT 245  < > 227 246  < > = values in this interval not displayed. Coagulation:  Recent Labs Lab 01/27/15 0513 01/28/15 0255 01/29/15 0641 01/30/15 0609  LABPROT 23.4* 29.9* 40.2* 39.7*  INR 2.10* 2.91*  4.31* 4.24*    Micro Results: Recent Results (from the past 240 hour(s))  Culture, blood (routine x 2)     Status: None (Preliminary result)   Collection Time: 01/26/15  1:14 PM  Result Value Ref Range Status   Specimen Description BLOOD RIGHT ANTECUBITAL  Final   Special Requests BOTTLES DRAWN AEROBIC AND ANAEROBIC 10CC  Final   Culture NO GROWTH 3 DAYS  Final   Report Status PENDING  Incomplete  Urine culture     Status: None   Collection Time: 01/26/15  2:52 PM  Result Value Ref Range Status   Specimen Description URINE, CLEAN CATCH  Final   Special Requests NONE  Final   Culture MULTIPLE SPECIES PRESENT, SUGGEST RECOLLECTION  Final   Report Status 01/27/2015 FINAL  Final  Culture, blood (routine x 2)     Status: None (Preliminary result)   Collection Time: 01/26/15  7:01 PM  Result Value Ref Range Status   Specimen Description BLOOD RIGHT HAND  Final   Special Requests IN PEDIATRIC BOTTLE 2CC  Final   Culture NO GROWTH 3 DAYS  Final   Report Status PENDING  Incomplete  MRSA PCR Screening     Status: None  Collection Time: 01/27/15  9:30 AM  Result Value Ref Range Status   MRSA by PCR NEGATIVE NEGATIVE Final    Comment:        The GeneXpert MRSA Assay (FDA approved for NASAL specimens only), is one component of a comprehensive MRSA colonization surveillance program. It is not intended to diagnose MRSA infection nor to guide or monitor treatment for MRSA infections.    Studies/Results: No results found. Medications: I have reviewed the patient's current medications. Scheduled Meds: . ceFEPime (MAXIPIME) IV  2 g Intravenous Q12H  . mycophenolate  360 mg Oral BID  . predniSONE  5 mg Oral QODAY  . sodium chloride  3 mL Intravenous Q12H  . Warfarin - Pharmacist Dosing Inpatient   Does not apply q1800   Continuous Infusions: . dextrose 5 % and 0.45% NaCl 75 mL/hr at 01/29/15 1800   PRN Meds:.acetaminophen **OR** acetaminophen, ondansetron **OR** ondansetron  (ZOFRAN) IV Assessment/Plan:  Fever: Patient met 2/4 SIRS criteria initially with the urinary tract a most likely source of infection, given many bacteria and 6-30 WBCs on microscopy. However, she denies ever having dysuria, and urine culture showed multiple species. 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 She has continued to improve significantly, with no fevers overnight. Blood cultures have been negative x3 days. If she spikes another fever today, we will try to obtain a TEE to query endocarditis. Lupus flare seems less likely at this point given her ds-DNA is around lowest levels this year based on [facility] clinic notes in Care everywhere. Moreover, we have not treated her for a flare and she has clinically improved. She will receive her final dose of cefepime tonight, and if she remains afebrile for 24 hours after he final antibiotic dose, then she should be appropriate for discharge. - Cefepime IV, she will receive her final dose tonight - Tylenol q4h prn - CBC in AM  AKI: Patient had an elevated creatinine at 1.11 in November at an ED visit for facial swelling, and was 2 on admission. Has improved to 1.38 on IVF. Proteinuria noted on UA with elevated protein/creatinine ratio (2.44). Could be related to a possible SLE flare, but renal function is improving without additional steroids. Likely pre-renal etiology. - 75 cc/hr D5 1/2 NS  Non-AG Metabolic Acidosis: Bicarb of 17 today. Patient had mild diarrhea before admission but was not severe. 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 Had improved to 8-9, but is 7.5 today. No bleeding overnight. Hemolytic etiology less likely given normal haptoglobin and normal peripheral smear. She reported that her Hgb tends to decrease in the setting of acute illnesses. Mycophenolate could also be a contributor.  - Goal Hgb of 7 or above  SLE:  Mycophenolate 360 mg BID, Prednisone 5 mg  Mitral Valve Replacement: INR elevated 4.24 today. Warfarin per pharm. Goal INR of 2.5-3.5.  Dispo: Disposition is deferred at this time, awaiting improvement of current medical problems.  Anticipated discharge in approximately 2-3 day(s).   The patient does have a current PCP Lovina Reach, MD) and does not need an [facility] hospital follow-up appointment after discharge.  The patient does not have transportation limitations that hinder transportation to clinic appointments.  .Services Needed at time of discharge: Y = Yes, Blank = No PT:   OT:   RN:   Equipment:   Other:     LOS: 4 days   Ysidro Evert  Marijean Bravo, MD 01/30/2015, 8:17 AM

## 2015-01-31 DIAGNOSIS — B9689 Other specified bacterial agents as the cause of diseases classified elsewhere: Secondary | ICD-10-CM

## 2015-01-31 LAB — BASIC METABOLIC PANEL
ANION GAP: 7 (ref 5–15)
BUN: 10 mg/dL (ref 6–20)
CHLORIDE: 121 mmol/L — AB (ref 101–111)
CO2: 16 mmol/L — ABNORMAL LOW (ref 22–32)
Calcium: 7.9 mg/dL — ABNORMAL LOW (ref 8.9–10.3)
Creatinine, Ser: 1.39 mg/dL — ABNORMAL HIGH (ref 0.44–1.00)
GFR calc Af Amer: >60 mL/min (ref >60)
GFR calc non Af Amer: 54 mL/min — ABNORMAL LOW (ref >60)
GLUCOSE: 89 mg/dL (ref 65–99)
POTASSIUM: 3.9 mmol/L (ref 3.5–5.1)
SODIUM: 144 mmol/L (ref 135–145)

## 2015-01-31 LAB — CBC
HCT: 24 % — ABNORMAL LOW (ref 36.0–46.0)
HEMOGLOBIN: 7.4 g/dL — AB (ref 12.0–15.0)
MCH: 25.3 pg — AB (ref 26.0–34.0)
MCHC: 30.8 g/dL (ref 30.0–36.0)
MCV: 81.9 fL (ref 78.0–100.0)
Platelets: 269 10*3/uL (ref 150–400)
RBC: 2.93 MIL/uL — AB (ref 3.87–5.11)
RDW: 16.5 % — ABNORMAL HIGH (ref 11.5–15.5)
WBC: 6.9 10*3/uL (ref 4.0–10.5)

## 2015-01-31 LAB — CULTURE, BLOOD (ROUTINE X 2)
CULTURE: NO GROWTH
Culture: NO GROWTH

## 2015-01-31 LAB — PROTIME-INR
INR: 3.85 — ABNORMAL HIGH (ref 0.00–1.49)
Prothrombin Time: 36.9 seconds — ABNORMAL HIGH (ref 11.6–15.2)

## 2015-01-31 MED ORDER — CEFDINIR 300 MG PO CAPS: 300.0000 mg | ORAL_CAPSULE | Freq: Two times a day (BID) | ORAL | Status: AC

## 2015-01-31 NOTE — Progress Notes (Signed)
ANTICOAGULATION CONSULT NOTE - Follow Up Consult  Pharmacy Consult for Warfarin Indication: mechanical mitral valve  Allergies  Allergen Reactions  . Ciprofloxacin Hives    Patient Measurements: Height: 5\' 2"  (157.5 cm) Weight: 179 lb 0.2 oz (81.2 kg) IBW/kg (Calculated) : 50.1  Vital Signs: Temp: 99.5 F (37.5 C) (01/06 0513) Temp Source: Oral (01/06 0513) BP: 133/85 mmHg (01/06 0513) Pulse Rate: 103 (01/06 0513)  Labs:  Recent Labs  01/29/15 0641 01/30/15 0609 01/31/15 0622  HGB 8.8* 7.5* 7.4*  HCT 28.1* 23.9* 24.0*  PLT 227 246 269  LABPROT 40.2* 39.7* 36.9*  INR 4.31* 4.24* 3.85*  CREATININE 1.41* 1.38* 1.39*    Estimated Creatinine Clearance: 63.7 mL/min (by C-G formula based on Cr of 1.39).  Medications:  Coumadin 7.5mg  daily PTA  Assessment: 20 year old female presenting with possible sepsis now s/p Cefepime x 5 days. 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 Her INR is elevated but trending down towards goal.  Can likely restart Coumadin tomorrow (1/7).  Goal of Therapy:  INR 2.5-3.5   Plan:  - No Warfarin tonight - Daily INR  05-08-1973, Pharm.D., BCPS Clinical Pharmacist Pager [phone] 01/31/2015 12:53 PM

## 2015-01-31 NOTE — Progress Notes (Signed)
D/c to home w/ sister.MD Erskine Squibb  discussed w/ pt to resume coumadin on 02/02/2015.all belongings given w/ d/c instructions.

## 2015-01-31 NOTE — Progress Notes (Signed)
Subjective:  Joan Miles said she feels fine today without new complaints. She denies any fever, chills, cough, or dysuria. She denies any chest pain. She had no recorded fevers overnight. No bleeding episodes.  Objective: Vital signs in last 24 hours: Filed Vitals:   01/30/15 0547 01/30/15 1457 01/30/15 2141 01/31/15 0513  BP: 129/82 132/91 139/86 133/85  Pulse: 88 85 88 103  Temp: 99.5 F (37.5 C) 98.5 F (36.9 C)  99.5 F (37.5 C)  TempSrc: Oral Oral  Oral  Resp: _0 Height:      Weight:      SpO2: 100% 100% 100% 100%   Weight change:  No intake or output data in the 24 hours ending 01/31/15 1321 Physical exam: General: Lying in bed, no acute distress. Not diahoretic HEENT: No scleral icterus. Moist mucous membranes Cardiovascular: RRR. No mumurs, rubs, or gallops Pulmonary: Clear to ausculation bilaterally. Good air movement bilaterally Abdominal: Soft, non-tender, non-distended. Extremities: No clubbing, cyanosis or edema Skin: No rashes. Warm and dry. Psychiatric: Cheerful affect. Normal behavior.  Lab Results: Basic Metabolic Panel:  Recent Labs Lab 01/30/15 0609 01/31/15 0622  NA 143 144  K 3.9 3.9  CL 121* 121*  CO2 17* 16*  GLUCOSE 86 89  BUN 10 10  CREATININE 1.38* 1.39*  CALCIUM 7.8* 7.9*   Liver Function Tests:  Recent Labs Lab 01/26/15 1324  AST 22  ALT 13*  ALKPHOS 55  BILITOT 0.4  PROT 7.1  ALBUMIN 2.5*  CBC:  Recent Labs Lab 01/26/15 1324  01/30/15 0609 01/31/15 0622  WBC 11.2*  < > 6.7 6.9  NEUTROABS 9.2*  --   --   --   HGB 7.5*  < > 7.5* 7.4*  HCT 25.2*  < > 23.9* 24.0*  MCV 84.0  < > 82.7 81.9  PLT 245  < > 246 269  < > = values in this interval not displayed. Coagulation:  Recent Labs Lab 01/28/15 0255 01/29/15 0641 01/30/15 0609 01/31/15 0622  LABPROT 29.9* 40.2* 39.7* 36.9*  INR 2.91* 4.31* 4.24* 3.85*    Micro Results: Recent Results (from the past 240 hour(s))  Culture, blood (routine x 2)      Status: None   Collection Time: 01/26/15  1:14 PM  Result Value Ref Range Status   Specimen Description BLOOD RIGHT ANTECUBITAL  Final   Special Requests BOTTLES DRAWN AEROBIC AND ANAEROBIC 10CC  Final   Culture NO GROWTH 5 DAYS  Final   Report Status 01/31/2015 FINAL  Final  Urine culture     Status: None   Collection Time: 01/26/15  2:52 PM  Result Value Ref Range Status   Specimen Description URINE, CLEAN CATCH  Final   Special Requests NONE  Final   Culture MULTIPLE SPECIES PRESENT, SUGGEST RECOLLECTION  Final   Report Status 01/27/2015 FINAL  Final  Culture, blood (routine x 2)     Status: None   Collection Time: 01/26/15  7:01 PM  Result Value Ref Range Status   Specimen Description BLOOD RIGHT HAND  Final   Special Requests IN PEDIATRIC BOTTLE 2CC  Final   Culture NO GROWTH 5 DAYS  Final   Report Status 01/31/2015 FINAL  Final  MRSA PCR Screening     Status: None   Collection Time: 01/27/15  9:30 AM  Result Value Ref Range Status   MRSA by PCR NEGATIVE NEGATIVE Final    Comment:        The GeneXpert  MRSA Assay (FDA approved for NASAL specimens only), is one component of a comprehensive MRSA colonization surveillance program. It is not intended to diagnose MRSA infection nor to guide or monitor treatment for MRSA infections.    Studies/Results: No results found. Medications: I have reviewed the patient's current medications. Scheduled Meds: . mycophenolate  360 mg Oral BID  . predniSONE  5 mg Oral QODAY  . sodium chloride  3 mL Intravenous Q12H  . Warfarin - Pharmacist Dosing Inpatient   Does not apply q1800   Continuous Infusions:   PRN Meds:.acetaminophen **OR** acetaminophen, ondansetron **OR** ondansetron (ZOFRAN) IV Assessment/Plan:  Fever: Patient met 2/4 SIRS criteria initially with the urinary tract a most likely source of infection, given many bacteria and 6-30 WBCs on microscopy. However, she denies ever having dysuria, and urine culture showed  multiple species. 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 She has continued to improve significantly, with no fevers overnight. Blood cultures have been negative x3 days Lupus flare seems less likely at this point given her ds-DNA is around lowest levels this year based on [facility] clinic notes in Care everywhere. Moreover, we have not treated her for a flare and she has clinically improved. She received her final dose of cefepime last night. and if she remains afebrile by this evening, she should be appropriate for discharge. - Tylenol q4h prn  AKI: Patient had an elevated creatinine at 1.11 in November at an ED visit for facial swelling, and was 2 on admission. Has improved to 1.39 on IVF. Proteinuria noted on UA with elevated protein/creatinine ratio (2.44). Could be related to a possible SLE flare, but renal function is improving without additional steroids. Likely pre-renal etiology. Encourage follow-up with [facility] as an outpatient. -Discontinue fluids today  Non-AG Metabolic Acidosis: Bicarb of 16 today. Patient had mild diarrhea before admission but was not severe. 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 Encourage follow-up with [facility] as an outpatient. - BMET tomorrow  Anemia: Hgb stable at 7.4 today. No bleeding overnight. Hemolytic etiology less likely given normal haptoglobin and normal peripheral smear. She reported that her Hgb tends to decrease in the setting of acute illnesses. Mycophenolate could also be a contributor.  - Encourage follow-up with [facility] as an outpatient.  SLE: Mycophenolate 360 mg BID, Prednisone 5 mg  Mitral Valve Replacement: INR elevated 3.85 today. Warfarin per pharm. Goal INR of 2.5-3.5. Encourage follow-up with [facility] as an outpatient.  Dispo: Disposition is deferred at this time, awaiting improvement of current medical problems.  Anticipated discharge in approximately 2-3 day(s).   The patient does have a current PCP Lovina Reach, MD) and does not need an [facility] hospital follow-up appointment after discharge.  The patient does not have transportation limitations that hinder transportation to clinic appointments.  .Services Needed at time of discharge: Y = Yes, Blank = No PT:   OT:   RN:   Equipment:   Other:     LOS: 5 days   Liberty Handy, MD 01/31/2015, 1:21 PM

## 2015-02-01 NOTE — Discharge Summary (Signed)
Name: Joan Miles MRN: [id number] DOB: 05/21/1995 20 y.o. PCP: Lovina Reach, MD  Date of Admission: 01/26/2015  2:46 PM Date of Discharge: 01/31/2015  6:51 PM Attending Physician: Aldine Contes, MD  Discharge Diagnosis: 1. Complicated UTI 2. Anemia 3. Acute Kidney Injury  Discharge Medications:   Medication List    TAKE these medications        acetaminophen 325 MG tablet  Commonly known as:  TYLENOL  Take 650 mg by mouth every 6 (six) hours as needed for mild pain.     cefdinir 300 MG capsule  Commonly known as:  OMNICEF  Take 1 capsule (300 mg total) by mouth 2 (two) times daily.     mycophenolate 360 MG Tbec EC tablet  Commonly known as:  MYFORTIC  Take 360 mg by mouth 2 (two) times daily.     predniSONE 5 MG tablet  Commonly known as:  DELTASONE  Take 5 mg by mouth every other day.     warfarin 7.5 MG tablet  Commonly known as:  COUMADIN  Take 7.5 mg by mouth daily.        Disposition and follow-up:   Joan Miles was discharged from [facility] in good condition.  At the hospital follow up visit please address:  1.  Patient needs follow-up as soon as possible with her specialist physicians at [facility] in rheumatology and cardiology for management of her SLE as well as her mechanical mitral valve.   2.  Labs / imaging needed at time of follow-up: INR checks, BMET for renal function  3.  Pending labs/ test needing follow-up: None  Follow-up Appointments:     Follow-up Information    Follow up with [facility] On 02/20/2015.   Specialty:  Internal Medicine   Why:  9:30  for hospital follow up   Contact information:   [place] [phone]      Follow up with Lovina Reach, MD.   Specialty:  Internal Medicine   Contact information:   [place] [address] [phone]       Discharge Instructions: Discharge Instructions    Diet -  low sodium heart healthy    Complete by:  As directed      Increase activity slowly    Complete by:  As directed            Joan Miles, it was an absolute joy taking care of you in the hospital. We are sending you home to complete an antibiotic therapy. You will take Cefdinir 300 mg twice a day, with your last dose being on 02/10/2015. Should you feel your fevers returning, specifically for a temperature over 100.4, please seek immediate medical attention. If you experience chest pain, shortness of breath, burning on urination, chills, or night sweats, please seek medical attention. Please be careful in the snow.   I encourage you to follow up with your doctors at [facility]. I see you have an appointment with [facility], which you should keep.  Procedures Performed:  Dg Chest 2 View  01/26/2015  CLINICAL DATA:  Productive cough and fever. EXAM: CHEST - 2 VIEW COMPARISON:  09/05/2014 FINDINGS: The heart size and mediastinal contours are within normal limits. Stable appearance of mitral valve prosthesis. There is no evidence of pulmonary edema, consolidation, pneumothorax, nodule or pleural fluid. The visualized skeletal structures are unremarkable. IMPRESSION: No active disease. Electronically Signed  By: Aletta Edouard M.D.   On: 01/26/2015 14:46   Admission HPI: Joan Miles is a 20 year old woman with a PMH of SLE, mitral valve replacement on warfarin, HTN who presents with a fever, chills, and night sweats that started on Friday. She noticed chills when she was at work at [employer], and when she took her temperature, it was 103.8. She endorses an occasionally productive cough with green sputum and congestion. She also had two non-bloody loose stools over the weekend. She said several of her coworkers have had colds. She denies unintended weight loss, light-headedness, headaches, myalgias, sinus pain, ear pain, neck stiffness, chest pain, shortness of breath, hemoptysis, dysuria,  vaginal discharge, abdominal pain, nausea, vomiting, leg pain or swelling. She denies tobacco, alcohol, illicit drug use, or sexual activity. She lives at home with her mother. She has a family history of HTN and SLE.   In the ED, she was afebrile but tachycardic to the 120s. Blood and urine cultures were drawn. She was hemodynamically stable. CXR was negative. UA showed moderate leukocytes, proteinuria, turbid appearance, hemoglobinuria. She had a mild leukocytosis to 11.2. Urine microscopy showed many squamous cells and many bacteria. Lactic acid was normal. She was started on vancomycin and cefepime. She was noted to be anemic to 7.5 with INR of 1.5, with baseline Hgb 11-12, but 8.1 on 11/15. She also had a creatinine of 2.02 from a baseline of <1, but it was 1.11 on 11/15.   Hospital Course by problem list:  Complicated UTI: Patient met 2/4 SIRS criteria initially with the urinary tract a most likely source of infection, given positive UA and many bacteria and 6-30 WBCs on microscopy - however, she continued to deny dysuria throughout her hospitalization. Multiple species were seen on urine culture. Blood cultures never demonstrated any growth. She was treated with IV cefepime (1/1-1/5) with her nightly fever initially in the 103s and down-trending to her final fever on 1/3 at 101.5, during which she denied any chills or night sweats. Given her mechanical mitral valve and immunosuppression on prednisone and mycophenolate, other sources were considered, but invasive diagnostic tests such as TEE were deferred given her dramatic and sustained resolution in symptoms. An SLE flare was also a consideration for her fevers, but her symptoms resolved without any pulse dose steroids  and no significant changes from baseline Anti-DS-DNA and C3/C4 that would be consistent with a flare. She was instructed to seek immediate medical attention if she developed recurrent fevers or new symptoms. She was discharged on  cefidinir 300 mg BID with her final dose on 02/10/2015.  Anemia: Patient had Hgb of 7.5 on admission that had decreased to the 6.4 the following day. She was transfused with 1u pRBCs, and her Hgb improved to the 8's and remained stable from 7.4-8.8 until discharge. There were no bleeding episodes, changes in urine color, chest pain, or shortness of breath. Hemolytic etiology was invoked, but haptoglobin was normal and peripheral smear did not have schistocytes. Ferritin was normal and TIBC was slightly low at 192  She reported that her hemoglobin tends to decrease in the setting of acute illnesses. Mycophenolate may have also played a role. We encouraged follow-up with [facility] as an outpatient.  AKI: Patient was noted an elevated creatinine at 1.11 in November at an ED visit for facial swelling and was 2 on admission. Her improved to 1.39 on D5 1/2 NS. Proteinuria noted on UA with elevated protein/creatinine ratio (2.44) that was suggestive of lupus nephritis,  but her renal function is improving without pulse steroids. Of note, on hospital day 2 she had mentioned transient lip swelling. It was thought this was likely due to diminished oral intake or possibly sepsis. Improvement on IVF was suggestive of a pre-renal etiology. We encouraged follow-up at [facility].  Non-AG Metabolic Acidosis: Patient was noted to have serum bicarbonate values in 16-19 range during hospitalization without an anion gap . Patient had some loose stool before admission but was not severe. It was possible it may have been caused by a lupus nephritis could also cause an RTA Type IV or her AKI per above. Follow-up with [facility] as an outpatient.   Mitral Valve Replacement: Patient had INR 1.53 on admission. Home warfarin was managed with the assistance of pharmacy for a goal INR of 2.5-3.5. Her INR pearked a 01/29/2015 and was 3.85 on day of discharge. She was told to skip her evening dose on day of discharge and to resume it 02/01/15. She was  encouraged to follow-up with [facility] as an outpatient.  SLE: She said that her lupus flares are typified by new rashes, which she denied during this hospitalization and lacked on exam. She was continued on Mycophenolate 360 mg BID, Prednisone 5 mg. She was encouraged to follow-up with [facility] as an outpatient.  Discharge Vitals:   BP 124/86 mmHg  Pulse 102  Temp(Src) 98.9 F (37.2 C) (Oral)  Resp 19  Ht 5' 2"  (1.575 m)  Wt 179 lb 0.2 oz (81.2 kg)  BMI 32.73 kg/m2  SpO2 100%  LMP 01/08/2015  Discharge Labs:  No results found for this or any previous visit (from the past 24 hour(s)).  Signed: Liberty Handy, MD 01/30/2014 6:51 PM

## 2015-02-07 ENCOUNTER — Emergency Department (INDEPENDENT_AMBULATORY_CARE_PROVIDER_SITE_OTHER)
Admission: EM | Admit: 2015-02-07 | Discharge: 2015-02-07 | Disposition: A | Discharge status: Home / Self Care | Payer: Medicaid Other | Source: Home / Self Care | Attending: Family Medicine | Admitting: Family Medicine

## 2015-02-07 ENCOUNTER — Encounter (HOSPITAL_COMMUNITY): Payer: Self-pay | Admitting: Emergency Medicine

## 2015-02-07 DIAGNOSIS — I10 Essential (primary) hypertension: Secondary | ICD-10-CM

## 2015-02-07 DIAGNOSIS — R040 Epistaxis: Secondary | ICD-10-CM | POA: Diagnosis not present

## 2015-02-07 NOTE — ED Notes (Signed)
C/o nosebleed States bleeding started at 11am this morning and has not stop Denies any injury Hx of nose bleeds

## 2015-02-07 NOTE — Discharge Instructions (Signed)
Hypertension Hypertension is another name for high blood pressure. High blood pressure forces your heart to work harder to pump blood. A blood pressure reading has two numbers, which includes a higher number over a lower number (example: 110/72). HOME CARE   Have your blood pressure rechecked by your doctor.  Only take medicine as told by your doctor. Follow the directions carefully. The medicine does not work as well if you skip doses. Skipping doses also puts you at risk for problems.  Do not smoke.  Monitor your blood pressure at home as told by your doctor. GET HELP IF:  You think you are having a reaction to the medicine you are taking.  You have repeat headaches or feel dizzy.  You have puffiness (swelling) in your ankles.  You have trouble with your vision. GET HELP RIGHT AWAY IF:   You get a very bad headache and are confused.  You feel weak, numb, or faint.  You get chest or belly (abdominal) pain.  You throw up (vomit).  You cannot breathe very well. MAKE SURE YOU:   Understand these instructions.  Will watch your condition.  Will get help right away if you are not doing well or get worse.   This information is not intended to replace advice given to you by your health care provider. Make sure you discuss any questions you have with your health care provider.   Document Released: 06/30/2007 Document Revised: 01/16/2013 Document Reviewed: 11/03/2012 Elsevier Interactive Patient Education 2016 [employer]. Nosebleed Nosebleeds are common. A nosebleed can be caused by many things, including:  Getting hit hard in the nose.  Infections.  Dryness in your nose.  A dry climate.  Medicines.  Picking your nose.  Your home heating and cooling systems. HOME CARE   Try controlling your nosebleed by pinching your nostrils gently. Do this for at least 10 minutes.  Avoid blowing or sniffing your nose for a number of hours after having a nosebleed.  Do not  put gauze inside of your nose yourself. If your nose was packed by your doctor, try to keep the pack inside of your nose until your doctor removes it.  If a gauze pack was used and it starts to fall out, gently replace it or cut off the end of it.  If a balloon catheter was used to pack your nose, do not cut or remove it unless told by your doctor.  Avoid lying down while you are having a nosebleed. Sit up and lean forward.  Use a nasal spray decongestant to help with a nosebleed as told by your doctor.  Do not use petroleum jelly or mineral oil in your nose. These can drip into your lungs.  Keep your house humid by using:  Less air conditioning.  A humidifier.  Aspirin and blood thinners make bleeding more likely. If you are prescribed these medicines and you have nosebleeds, ask your doctor if you should stop taking the medicines or adjust the dose. Do not stop medicines unless told by your doctor.  Resume your normal activities as you are able. Avoid straining, lifting, or bending at your waist for several days.  If your nosebleed was caused by dryness in your nose, use over-the-counter saline nasal spray or gel. If you must use a lubricant:  Choose one that is water-soluble.  Use it only as needed.  Do not use it within several hours of lying down.  Keep all follow-up visits as told by your doctor. This  is important. GET HELP IF:  You have a fever.  You get frequent nosebleeds.  You are getting nosebleeds more often. GET HELP RIGHT AWAY IF:  Your nosebleed lasts longer than 20 minutes.  Your nosebleed occurs after an injury to your face, and your nose looks crooked or broken.  You have unusual bleeding from other parts of your body.  You have unusual bruising on other parts of your body.  You feel light-headed or dizzy.  You become sweaty.  You throw up (vomit) blood.  You have a nosebleed after a head injury.   This information is not intended to replace  advice given to you by your health care provider. Make sure you discuss any questions you have with your health care provider.   Document Released: 10/21/2007 Document Revised: 02/01/2014 Document Reviewed: 08/27/2013 Elsevier Interactive Patient Education [employer]

## 2015-02-08 NOTE — ED Provider Notes (Signed)
CSN: [id number]     Arrival date & time 02/07/15  1324 History   First MD Initiated Contact with Patient 02/07/15 1435     Chief Complaint  Patient presents with  . Epistaxis   (Consider location/radiation/quality/duration/timing/severity/associated sxs/prior Treatment) HPI nnose bleed today,no gushing, was able to control with pressure, concerned that blood pressure is elevated and this s causing nose bleeds. Just got medicad so has not had medication in a bit of time.  Past Medical History  Diagnosis Date  . Lupus (HCC)   . Mitral valve stenosis   . Hypertension   . Renal insufficiency    Past Surgical History  Procedure Laterality Date  . Value replacement    . Laparoscopic appendectomy N/A 02/11/2013    Procedure: APPENDECTOMY LAPAROSCOPIC;  Surgeon: Liz Malady, MD;  Location: [facility] OR;  Service: General;  Laterality: N/A;  . Laparoscopic salpingo oopherectomy Right 02/11/2013    Procedure: LAPAROSCOPIC SALPINGO OOPHORECTOMY;  Surgeon: Lazaro Arms, MD;  Location: [facility] OR;  Service: Gynecology;  Laterality: Right;  . Tonsillectomy    . Appendectomy  January 2015  . Esophagogastroduodenoscopy N/A 02/14/2013    Procedure: ESOPHAGOGASTRODUODENOSCOPY (EGD);  Surgeon: Hart Carwin, MD;  Location: [facility] ENDOSCOPY;  Service: Endoscopy;  Laterality: N/A;  . Cardiac surgery     History reviewed. No pertinent family history. Social History  Substance Use Topics  . Smoking status: Never Smoker   . Smokeless tobacco: Never Used  . Alcohol Use: No   OB History    No data available     Review of Systems ROS +'ve nose bleed  Denies: HEADACHE, NAUSEA, ABDOMINAL PAIN, CHEST PAIN, CONGESTION, DYSURIA, SHORTNESS OF BREATH  Allergies  Ciprofloxacin  Home Medications   Prior to Admission medications   Medication Sig Start Date End Date Taking? Authorizing Provider  acetaminophen (TYLENOL) 325 MG tablet Take 650 mg by mouth every 6 (six) hours as needed for mild pain.    Historical  Provider, MD  cefdinir (OMNICEF) 300 MG capsule Take 1 capsule (300 mg total) by mouth 2 (two) times daily. 01/31/15 02/10/15  Ruben Im, MD  mycophenolate (MYFORTIC) 360 MG TBEC EC tablet Take 360 mg by mouth 2 (two) times daily.    Historical Provider, MD  predniSONE (DELTASONE) 5 MG tablet Take 5 mg by mouth every other day.     Historical Provider, MD  warfarin (COUMADIN) 7.5 MG tablet Take 7.5 mg by mouth daily.    Historical Provider, MD   Meds Ordered and Administered this Visit  Medications - No data to display  BP 169/121 mmHg  Pulse 97  Temp(Src) 98.1 F (36.7 C) (Oral)  Resp 16  SpO2 100%  LMP 01/08/2015 No data found.   Physical Exam  Constitutional: She is oriented to person, place, and time. She appears well-developed and well-nourished.  HENT:  Head: Normocephalic and atraumatic.  Right Ear: External ear normal.  Left Ear: External ear normal.  Mouth/Throat: Oropharynx is clear and moist.  Nasal mucosa on left is a bit dry. No lesions  Pulmonary/Chest: Effort normal.  Neurological: She is alert and oriented to person, place, and time.  Skin: Skin is warm and dry.  Nursing note and vitals reviewed.   ED Course  Procedures (including critical care time)  Labs Review Labs Reviewed - No data to display  Imaging Review No results found.   Visual Acuity Review  Right Eye Distance:   Left Eye Distance:   Bilateral Distance:    Right  Eye Near:   Left Eye Near:    Bilateral Near:         MDM   1. Nosebleed, symptom   2. Essential hypertension    Take medication Symptomatic treatment Moisturize inner nostril    Tharon Aquas, [place] 02/08/15 2100

## 2015-02-20 ENCOUNTER — Ambulatory Visit: Payer: Medicaid Other | Admitting: Family Medicine

## 2015-03-03 ENCOUNTER — Encounter: Payer: Self-pay | Admitting: Student

## 2015-03-03 ENCOUNTER — Ambulatory Visit (INDEPENDENT_AMBULATORY_CARE_PROVIDER_SITE_OTHER): Payer: Medicaid Other | Admitting: *Deleted

## 2015-03-03 ENCOUNTER — Ambulatory Visit (INDEPENDENT_AMBULATORY_CARE_PROVIDER_SITE_OTHER): Payer: Medicaid Other | Admitting: Student

## 2015-03-03 VITALS — BP 128/67 | HR 108 | Temp 98.6°F | Ht 62.0 in | Wt 165.0 lb

## 2015-03-03 DIAGNOSIS — Z954 Presence of other heart-valve replacement: Secondary | ICD-10-CM | POA: Diagnosis not present

## 2015-03-03 DIAGNOSIS — Z7901 Long term (current) use of anticoagulants: Secondary | ICD-10-CM | POA: Diagnosis present

## 2015-03-03 DIAGNOSIS — I10 Essential (primary) hypertension: Secondary | ICD-10-CM

## 2015-03-03 DIAGNOSIS — Z952 Presence of prosthetic heart valve: Secondary | ICD-10-CM | POA: Insufficient documentation

## 2015-03-03 DIAGNOSIS — M329 Systemic lupus erythematosus, unspecified: Secondary | ICD-10-CM

## 2015-03-03 DIAGNOSIS — M3214 Glomerular disease in systemic lupus erythematosus: Secondary | ICD-10-CM

## 2015-03-03 LAB — POCT UA - MICROSCOPIC ONLY

## 2015-03-03 LAB — POCT URINALYSIS DIPSTICK
BILIRUBIN UA: NEGATIVE
GLUCOSE UA: NEGATIVE
KETONES UA: NEGATIVE
NITRITE UA: NEGATIVE
PH UA: 6
Protein, UA: 100
Spec Grav, UA: 1.015
Urobilinogen, UA: 0.2

## 2015-03-03 LAB — POCT INR: INR: 3.5

## 2015-03-03 NOTE — Assessment & Plan Note (Signed)
Blood pressure stable. He currently managed with lisinopril and nifedipine - We'll continue to follow

## 2015-03-03 NOTE — Patient Instructions (Addendum)
Follow-up with PCP as scheduled If you have questions or concerns, please call the office at [phone]

## 2015-03-03 NOTE — Progress Notes (Signed)
   Subjective:    Patient ID: Joan Miles, female    DOB: Jul 03, 1995, 20 y.o.   MRN: [id number]   CC: New patient  HPI: 20 year old female with a history of lupus, lupus nephritis, history of leaving sacks endocarditis, history of mechanical mitral valve replacement on Coumadin  Today has no complaints however she is concerned about some of the lab testing she is due for. Significantly she was recently diagnosed with C. difficile at [facility] and is being treated with Flagyl for this. However she denies diarrhea  Lupus - She is managed by rheumatology for this - Has started cyclophosphamide therapy -  plans to continue monthly infusions. - She will need CBCs right after she gets her therapy and would like to have her CBCs drawn here.  Lupus nephritis - This is also managed by her rheumatologist who has requested that she obtain a urinalysis - [facility] Mom has brought her discharge paperwork with her, however not her discharge summary -   Mechanical heart valve replacement - She is seen by cardiology for this and is on Coumadin - She typically gets weekly INRs. However she recently had a dose reduction in her Coumadin to 4 mg daily - She had an INR drawn yesterday at [facility] but she doesn't know what her results are. - She would like to have her INR rechecked today - Her goal INR is between 2.5 and 3.5 - She recently did have chest pain which was considered to be pleuritic and has had the first part of a stress test.  - She denies chest pain or shortness of breath today   Review of Systems ROS  Per the history of present illness otherwise she denies recent illnesses, fevers, nausea, vomiting, diarrhea, headache, changes in vision  Past Medical, Surgical, Social, and Family History Reviewed & Updated per EMR.   Objective:  BP 128/67 mmHg  Pulse 108  Temp(Src) 98.6 F (37 C) (Oral)  Ht 5\' 2"  (1.575 m)  Wt 165 lb  (74.844 kg)  BMI 30.17 kg/m2  LMP 02/14/2015 Vitals and nursing note reviewed  General: NAD Cardiac: RRR, normal heart sounds, no murmurs. Respiratory: CTAB, normal effort Abdomen: soft, nontender, nondistended,  Bowel sounds present Extremities: no edema or cyanosis. WWP. Skin: warm and dry, no rashes noted Neuro: alert and oriented, no focal deficits   Assessment & Plan:    H/O mitral valve replacement with mechanical valve Currently seen by cardiology for this. She is on Coumadin and aspirin. She is additionally on atorvastatin to further reduce her risk for thrombosis - Will follow INRs and adjust Coumadin as needed - Will follow cardiac stress test - Consider lipid panel as well as CMP to evaluate LFTs  Lupus (systemic lupus erythematosus) Currently being managed with rheumatology with cyclophosphamide - We'll follow with CBCs after her infusion therapies  Accelerated hypertension Blood pressure stable. He currently managed with lisinopril and nifedipine - We'll continue to follow  Lupus nephritis (HCC) Continue to be followed by rheumatology for this.  - Today Will collect urinalysis as requested by rheumatologist     Kayse Puccini A. 02/16/2015 MD, MS Family Medicine Resident PGY-2 Pager [phone]

## 2015-03-03 NOTE — Assessment & Plan Note (Signed)
Continue to be followed by rheumatology for this.  - Today Will collect urinalysis as requested by rheumatologist

## 2015-03-03 NOTE — Assessment & Plan Note (Addendum)
Currently seen by cardiology for this. She is on Coumadin and aspirin. She is additionally on atorvastatin to further reduce her risk for thrombosis - Will follow INRs and adjust Coumadin as needed - Will follow cardiac stress test - Consider lipid panel as well as CMP to evaluate LFTs

## 2015-03-03 NOTE — Assessment & Plan Note (Signed)
Currently being managed with rheumatology with cyclophosphamide - We'll follow with CBCs after her infusion therapies

## 2015-03-04 ENCOUNTER — Ambulatory Visit: Payer: Medicaid Other | Admitting: Student

## 2015-03-18 ENCOUNTER — Ambulatory Visit (INDEPENDENT_AMBULATORY_CARE_PROVIDER_SITE_OTHER): Payer: Medicaid Other | Admitting: *Deleted

## 2015-03-18 DIAGNOSIS — Z7901 Long term (current) use of anticoagulants: Secondary | ICD-10-CM | POA: Diagnosis present

## 2015-03-18 DIAGNOSIS — Z954 Presence of other heart-valve replacement: Secondary | ICD-10-CM

## 2015-03-18 DIAGNOSIS — Z952 Presence of prosthetic heart valve: Secondary | ICD-10-CM

## 2015-03-18 LAB — POCT INR: INR: 2.7

## 2015-03-23 ENCOUNTER — Emergency Department (HOSPITAL_COMMUNITY)
Admission: EM | Admit: 2015-03-23 | Discharge: 2015-03-23 | Disposition: A | Discharge status: Home / Self Care | Payer: Medicaid Other | Attending: Emergency Medicine | Admitting: Emergency Medicine

## 2015-03-23 ENCOUNTER — Encounter (HOSPITAL_COMMUNITY): Payer: Self-pay | Admitting: *Deleted

## 2015-03-23 DIAGNOSIS — Z79899 Other long term (current) drug therapy: Secondary | ICD-10-CM | POA: Insufficient documentation

## 2015-03-23 DIAGNOSIS — Z87448 Personal history of other diseases of urinary system: Secondary | ICD-10-CM | POA: Insufficient documentation

## 2015-03-23 DIAGNOSIS — M79645 Pain in left finger(s): Secondary | ICD-10-CM | POA: Diagnosis present

## 2015-03-23 DIAGNOSIS — Z7952 Long term (current) use of systemic steroids: Secondary | ICD-10-CM | POA: Insufficient documentation

## 2015-03-23 DIAGNOSIS — Z7901 Long term (current) use of anticoagulants: Secondary | ICD-10-CM | POA: Diagnosis not present

## 2015-03-23 DIAGNOSIS — M329 Systemic lupus erythematosus, unspecified: Secondary | ICD-10-CM | POA: Insufficient documentation

## 2015-03-23 DIAGNOSIS — I10 Essential (primary) hypertension: Secondary | ICD-10-CM | POA: Diagnosis not present

## 2015-03-23 DIAGNOSIS — Z7982 Long term (current) use of aspirin: Secondary | ICD-10-CM | POA: Insufficient documentation

## 2015-03-23 DIAGNOSIS — L03012 Cellulitis of left finger: Secondary | ICD-10-CM

## 2015-03-23 MED ORDER — CEPHALEXIN 500 MG PO CAPS: 500.0000 mg | ORAL_CAPSULE | Freq: Four times a day (QID) | ORAL | Status: AC

## 2015-03-23 NOTE — ED Notes (Signed)
Pt reports infection on Lt middle  Finger  At base of nail . Infection started after having  False nails placed.  Pt reports seeing puss like drainage under nail. Pt attempted to remove nail today but unable to. Skin is dry and intact . No drainage observed at this time.

## 2015-03-23 NOTE — Discharge Instructions (Signed)
Fingertip Infection When an infection is around the nail, it is called a paronychia. When it appears over the tip of the finger, it is called a felon. These infections are due to minor injuries or cracks in the skin. If they are not treated properly, they can lead to bone infection and permanent damage to the fingernail. Incision and drainage is necessary if a pus pocket (an abscess) has formed. Antibiotics and pain medicine may also be needed. Keep your hand elevated for the next 2-3 days to reduce swelling and pain. If a pack was placed in the abscess, it should be removed in 1-2 days by your caregiver. Soak the finger in warm water for 20 minutes 4 times daily to help promote drainage. Keep the hands as dry as possible. Wear protective gloves with cotton liners. See your caregiver for follow-up care as recommended.  HOME CARE INSTRUCTIONS   Keep wound clean, dry and dressed as suggested by your caregiver.  Soak in warm salt water for fifteen minutes, four times per day for bacterial infections.  Your caregiver will prescribe an antibiotic if a bacterial infection is suspected. Take antibiotics as directed and finish the prescription, even if the problem appears to be improving before the medicine is gone.  Only take over-the-counter or prescription medicines for pain, discomfort, or fever as directed by your caregiver. SEEK IMMEDIATE MEDICAL CARE IF:  There is redness, swelling, or increasing pain in the wound.  Pus or any other unusual drainage is coming from the wound.  An unexplained oral temperature above 102 F (38.9 C) develops.  You notice a foul smell coming from the wound or dressing. MAKE SURE YOU:   Understand these instructions.  Monitor your condition.  Contact your caregiver if you are getting worse or not improving.   This information is not intended to replace advice given to you by your health care provider. Make sure you discuss any questions you have with your  health care provider.   Document Released: 02/19/2004 Document Revised: 04/05/2011 Document Reviewed: 07/01/2014 Elsevier Interactive Patient Education 2016 [employer] Inc[employer]  

## 2015-03-23 NOTE — ED Provider Notes (Signed)
CSN: [id number]     Arrival date & time 03/23/15  0831 History  By signing my name below, I, [name], attest that this documentation has been prepared under the direction and in the presence of Cheri Fowler, PA-C. Electronically Signed: [name], ED Scribe. 03/23/2015. 9:16 AM.      Chief Complaint  Patient presents with  . Finger Injury     The history is provided by the patient. No language interpreter was used.   HPI Comments: Joan Miles is a 20 y.o. female who presents to the Emergency Department complaining of a constant, gradual worsening left middle finger redness with associated drainage onset this morning. Minimal pain.  Pt reports having acrylic nails put on a few days ago and reports seeing pus and drainage from her nail bed area. She has not taken any medication to alleviate her pain. Denies fever, nausea, and vomiting.  Past Medical History  Diagnosis Date  . Lupus (HCC)   . Mitral valve stenosis   . Hypertension   . Renal insufficiency   . Lupus [facility])    Past Surgical History  Procedure Laterality Date  . Value replacement    . Laparoscopic appendectomy N/A 02/11/2013    Procedure: APPENDECTOMY LAPAROSCOPIC;  Surgeon: Liz Malady, MD;  Location: [facility] OR;  Service: General;  Laterality: N/A;  . Laparoscopic salpingo oopherectomy Right 02/11/2013    Procedure: LAPAROSCOPIC SALPINGO OOPHORECTOMY;  Surgeon: Lazaro Arms, MD;  Location: [facility] OR;  Service: Gynecology;  Laterality: Right;  . Tonsillectomy    . Appendectomy  January 2015  . Esophagogastroduodenoscopy N/A 02/14/2013    Procedure: ESOPHAGOGASTRODUODENOSCOPY (EGD);  Surgeon: Hart Carwin, MD;  Location: [facility] ENDOSCOPY;  Service: Endoscopy;  Laterality: N/A;  . Cardiac surgery     Family History  Problem Relation Age of Onset  . Lupus Maternal Aunt    Social History  Substance Use Topics  . Smoking status: Never Smoker   . Smokeless tobacco: Never Used  . Alcohol Use: No   OB History    No  data available     Review of Systems  Constitutional: Negative for fever.  Gastrointestinal: Negative for nausea and vomiting.  All other systems reviewed and are negative.     Allergies  Ciprofloxacin  Home Medications   Prior to Admission medications   Medication Sig Start Date End Date Taking? Authorizing Provider  acetaminophen (TYLENOL) 325 MG tablet Take 650 mg by mouth every 6 (six) hours as needed for mild pain.    Historical Provider, MD  aspirin 81 MG tablet Take 81 mg by mouth daily.    Historical Provider, MD  atorvastatin (LIPITOR) 80 MG tablet Take 80 mg by mouth daily.    Historical Provider, MD  cephALEXin (KEFLEX) 500 MG capsule Take 1 capsule (500 mg total) by mouth 4 (four) times daily. 03/23/15   Cheri Fowler, PA-C  cyclophosphamide (CYTOXAN) 2 g chemo injection Inject into the vein once.    Historical Provider, MD  cyclophosphamide (CYTOXAN) 25 MG tablet Take 25 mg by mouth daily. Give on an empty stomach 1 hour before or 2 hours after meals.    Historical Provider, MD  ferrous sulfate 325 (65 FE) MG tablet Take 325 mg by mouth daily with breakfast.    Historical Provider, MD  lisinopril (PRINIVIL,ZESTRIL) 5 MG tablet Take 5 mg by mouth daily.    Historical Provider, MD  mycophenolate (MYFORTIC) 360 MG TBEC EC tablet Take 360 mg by mouth 2 (two) times  daily. Reported on 03/03/2015    Historical Provider, MD  NIFEdipine (PROCARDIA-XL/ADALAT CC) 60 MG 24 hr tablet Take 60 mg by mouth daily.    Historical Provider, MD  predniSONE (DELTASONE) 5 MG tablet Take 40 mg by mouth every other day.     Historical Provider, MD  warfarin (COUMADIN) 7.5 MG tablet Take 4 mg by mouth daily.     Historical Provider, MD   Triage vitals: BP 140/81 mmHg  Pulse 108  Temp(Src) 97.8 F (36.6 C) (Oral)  Resp 16  Ht 5\' 2"  (1.575 m)  Wt 169 lb (76.658 kg)  BMI 30.90 kg/m2  SpO2 100%  LMP 03/17/2015 Physical Exam  Constitutional: She is oriented to person, place, and time. She appears  well-developed and well-nourished.  HENT:  Head: Normocephalic and atraumatic.  Right Ear: External ear normal.  Left Ear: External ear normal.  Eyes: Conjunctivae are normal. No scleral icterus.  Neck: No tracheal deviation present.  Pulmonary/Chest: Effort normal. No respiratory distress.  Abdominal: She exhibits no distension.  Musculoskeletal: Normal range of motion.  Left middle finger minimally TTP, area of erythema on medial nail fold. No fluctuance or induration. No drainage.   Neurological: She is alert and oriented to person, place, and time.  Strength and sensation intact.   Skin: Skin is warm and dry.  Psychiatric: She has a normal mood and affect. Her behavior is normal.  Nursing note and vitals reviewed.   ED Course  Procedures  DIAGNOSTIC STUDIES: Oxygen Saturation is 100% on RA, normal by my interpretation.  COORDINATION OF CARE:  9:08 AM Discussed treatment plan which includes keflex and soak nail off with pt at bedside and pt agreed to plan.  Labs Review Labs Reviewed - No data to display  Imaging Review No results found. I have personally reviewed and evaluated these images and lab results as part of my medical decision-making.   EKG Interpretation None      MDM    Final diagnoses:  Cellulitis of finger of left hand    Patient presents with findings consistent with early cellulitis, possible infection of the left middle finger along the medial nail fold. No fluctuance to indicate abscess. Doubt paronychia. Doubt felon. No systemic symptoms. VSS, NAD. Plan to discharge home with Keflex. Recommend ibuprofen or Tylenol for pain control. Patient advised to return to nail salon for acrylic nail removal. Discussed return precautions. Follow up PCP. Patient agrees and acknowledges the above plan for discharge.  I personally performed the services described in this documentation, which was scribed in my presence. The recorded information has been reviewed and  is accurate.    03/19/2015, PA-C 03/23/15 03/25/15  [id number], MD 03/23/15 [phone]

## 2015-03-23 NOTE — ED Notes (Signed)
Declined W/C at D/C and was escorted to lobby by RN. 

## 2015-03-25 ENCOUNTER — Ambulatory Visit: Payer: Medicaid Other | Admitting: Family Medicine

## 2015-04-01 ENCOUNTER — Other Ambulatory Visit (HOSPITAL_COMMUNITY)
Admission: RE | Admit: 2015-04-01 | Discharge: 2015-04-01 | Disposition: A | Discharge status: Home / Self Care | Payer: Medicaid Other | Source: Ambulatory Visit | Attending: Family Medicine | Admitting: Family Medicine

## 2015-04-01 ENCOUNTER — Ambulatory Visit (INDEPENDENT_AMBULATORY_CARE_PROVIDER_SITE_OTHER): Payer: Self-pay | Admitting: Student

## 2015-04-01 ENCOUNTER — Encounter: Payer: Self-pay | Admitting: Student

## 2015-04-01 VITALS — BP 110/78 | HR 78 | Temp 98.3°F | Ht 62.0 in | Wt 177.0 lb

## 2015-04-01 DIAGNOSIS — Z0000 Encounter for general adult medical examination without abnormal findings: Secondary | ICD-10-CM | POA: Insufficient documentation

## 2015-04-01 DIAGNOSIS — Z01419 Encounter for gynecological examination (general) (routine) without abnormal findings: Secondary | ICD-10-CM | POA: Insufficient documentation

## 2015-04-01 DIAGNOSIS — Z124 Encounter for screening for malignant neoplasm of cervix: Secondary | ICD-10-CM

## 2015-04-01 NOTE — Progress Notes (Signed)
   Subjective:    Patient ID: Joan Miles, female    DOB: [birth year], 20 y.o.   MRN: [id number]   CC: Pap smear  HPI: 20 year old female presenting for Pap smear  Pap smear - This is the first Pap smear that she has ever given that she is 20 - Not currently sexually active - LMP 03/17/2015 - Denies history of STDs, denies current abdominal pain, vaginal irritation, vaginal discharge -   Review of Systems ROS Per history of present illness, otherwise denies recent illnesses, fevers, nausea, vomiting, diarrhea, weakness, headache Past Medical, Surgical, Social, and Family History Reviewed & Updated per EMR.   Objective:  BP 110/78 mmHg  Pulse 78  Temp(Src) 98.3 F (36.8 C) (Oral)  Ht 5\' 2"  (1.575 m)  Wt 177 lb (80.287 kg)  BMI 32.37 kg/m2  SpO2 99%  LMP 03/17/2015 Vitals and nursing note reviewed  General: NAD Cardiac: RRR, normal heart sounds,  Respiratory: CTAB, normal effort Skin: warm and dry, no rashes noted Neuro: alert and oriented, no focal deficits  Pelvic: Normal EGBUS, normal vaginal, normal cervix with no CMT, normal mobile uterus, normal adnexa with no masses, no adnexal tenderness   Assessment & Plan:    Healthcare maintenance Pap smear performed today. Will follow results and inform patient.     Shlonda Dolloff A. 03/19/2015 MD, MS Family Medicine Resident PGY-2 Pager [phone]

## 2015-04-01 NOTE — Patient Instructions (Signed)
Follow-up in 3 months You had a Pap smear done today. If it is abnormal you will be called with the results. If you have any questions or concerns call the office at [phone]

## 2015-04-01 NOTE — Assessment & Plan Note (Signed)
Pap smear performed today. Will follow results and inform patient.

## 2015-04-03 LAB — CYTOLOGY - PAP

## 2015-04-21 ENCOUNTER — Encounter (HOSPITAL_COMMUNITY): Payer: Self-pay | Admitting: Emergency Medicine

## 2015-04-21 DIAGNOSIS — I10 Essential (primary) hypertension: Secondary | ICD-10-CM | POA: Insufficient documentation

## 2015-04-21 DIAGNOSIS — R509 Fever, unspecified: Secondary | ICD-10-CM | POA: Insufficient documentation

## 2015-04-21 DIAGNOSIS — R51 Headache: Secondary | ICD-10-CM | POA: Insufficient documentation

## 2015-04-21 LAB — URINALYSIS, ROUTINE W REFLEX MICROSCOPIC
GLUCOSE, UA: NEGATIVE mg/dL
HGB URINE DIPSTICK: NEGATIVE
KETONES UR: NEGATIVE mg/dL
Nitrite: NEGATIVE
PROTEIN: 100 mg/dL — AB
Specific Gravity, Urine: 1.037 — ABNORMAL HIGH (ref 1.005–1.030)
pH: 6.5 (ref 5.0–8.0)

## 2015-04-21 LAB — CBC WITH DIFFERENTIAL/PLATELET
Basophils Absolute: 0 10*3/uL (ref 0.0–0.1)
Basophils Relative: 0 %
EOS PCT: 1 %
Eosinophils Absolute: 0.1 10*3/uL (ref 0.0–0.7)
HCT: 33.6 % — ABNORMAL LOW (ref 36.0–46.0)
Hemoglobin: 10.5 g/dL — ABNORMAL LOW (ref 12.0–15.0)
LYMPHS ABS: 0.6 10*3/uL — AB (ref 0.7–4.0)
LYMPHS PCT: 8 %
MCH: 30.2 pg (ref 26.0–34.0)
MCHC: 31.3 g/dL (ref 30.0–36.0)
MCV: 96.6 fL (ref 78.0–100.0)
MONO ABS: 0.4 10*3/uL (ref 0.1–1.0)
Monocytes Relative: 6 %
Neutro Abs: 6.2 10*3/uL (ref 1.7–7.7)
Neutrophils Relative %: 85 %
PLATELETS: 230 10*3/uL (ref 150–400)
RBC: 3.48 MIL/uL — ABNORMAL LOW (ref 3.87–5.11)
RDW: 16.6 % — AB (ref 11.5–15.5)
WBC: 7.2 10*3/uL (ref 4.0–10.5)

## 2015-04-21 LAB — URINE MICROSCOPIC-ADD ON

## 2015-04-21 NOTE — ED Notes (Signed)
Pt. reports intermittent headache with fever onset yesterday , denies emesis or diarrhea .

## 2015-04-22 ENCOUNTER — Encounter: Payer: Self-pay | Admitting: Student

## 2015-04-22 ENCOUNTER — Emergency Department (HOSPITAL_COMMUNITY)
Admission: EM | Admit: 2015-04-22 | Discharge: 2015-04-22 | Disposition: A | Discharge status: Home / Self Care | Payer: Medicaid Other | Attending: Emergency Medicine | Admitting: Emergency Medicine

## 2015-04-22 LAB — BASIC METABOLIC PANEL
Anion gap: 9 (ref 5–15)
BUN: 32 mg/dL — ABNORMAL HIGH (ref 6–20)
CALCIUM: 8.6 mg/dL — AB (ref 8.9–10.3)
CHLORIDE: 110 mmol/L (ref 101–111)
CO2: 21 mmol/L — ABNORMAL LOW (ref 22–32)
CREATININE: 1.29 mg/dL — AB (ref 0.44–1.00)
GFR calc non Af Amer: 59 mL/min — ABNORMAL LOW (ref >60)
Glucose, Bld: 126 mg/dL — ABNORMAL HIGH (ref 65–99)
Potassium: 4.4 mmol/L (ref 3.5–5.1)
SODIUM: 140 mmol/L (ref 135–145)

## 2015-04-22 LAB — POC URINE PREG, ED: PREG TEST UR: NEGATIVE

## 2015-04-22 NOTE — ED Notes (Signed)
Pt stated that since she felt her vital signs were fine that she wanted to just wait to see her Primary Care Doctor in the morning. Told Pt she only had one pt infront of her but she still stated she wanted to leave.

## 2015-04-23 ENCOUNTER — Emergency Department (HOSPITAL_COMMUNITY): Payer: Medicaid Other

## 2015-04-23 ENCOUNTER — Emergency Department (HOSPITAL_COMMUNITY)
Admission: EM | Admit: 2015-04-23 | Discharge: 2015-04-23 | Disposition: A | Discharge status: Home / Self Care | Payer: Medicaid Other | Attending: Emergency Medicine | Admitting: Emergency Medicine

## 2015-04-23 ENCOUNTER — Encounter (HOSPITAL_COMMUNITY): Payer: Self-pay | Admitting: Emergency Medicine

## 2015-04-23 DIAGNOSIS — Z7952 Long term (current) use of systemic steroids: Secondary | ICD-10-CM | POA: Insufficient documentation

## 2015-04-23 DIAGNOSIS — Z79899 Other long term (current) drug therapy: Secondary | ICD-10-CM | POA: Insufficient documentation

## 2015-04-23 DIAGNOSIS — Z7982 Long term (current) use of aspirin: Secondary | ICD-10-CM | POA: Insufficient documentation

## 2015-04-23 DIAGNOSIS — B349 Viral infection, unspecified: Secondary | ICD-10-CM | POA: Insufficient documentation

## 2015-04-23 DIAGNOSIS — I10 Essential (primary) hypertension: Secondary | ICD-10-CM | POA: Insufficient documentation

## 2015-04-23 DIAGNOSIS — Z7901 Long term (current) use of anticoagulants: Secondary | ICD-10-CM | POA: Insufficient documentation

## 2015-04-23 DIAGNOSIS — Z792 Long term (current) use of antibiotics: Secondary | ICD-10-CM | POA: Insufficient documentation

## 2015-04-23 DIAGNOSIS — M329 Systemic lupus erythematosus, unspecified: Secondary | ICD-10-CM | POA: Insufficient documentation

## 2015-04-23 DIAGNOSIS — Z87448 Personal history of other diseases of urinary system: Secondary | ICD-10-CM | POA: Insufficient documentation

## 2015-04-23 LAB — CBC WITH DIFFERENTIAL/PLATELET
BASOS ABS: 0 10*3/uL (ref 0.0–0.1)
Basophils Relative: 0 %
EOS ABS: 0.1 10*3/uL (ref 0.0–0.7)
EOS PCT: 1 %
HCT: 34.4 % — ABNORMAL LOW (ref 36.0–46.0)
Hemoglobin: 10.6 g/dL — ABNORMAL LOW (ref 12.0–15.0)
LYMPHS ABS: 1.1 10*3/uL (ref 0.7–4.0)
Lymphocytes Relative: 13 %
MCH: 30.1 pg (ref 26.0–34.0)
MCHC: 30.8 g/dL (ref 30.0–36.0)
MCV: 97.7 fL (ref 78.0–100.0)
Monocytes Absolute: 0.4 10*3/uL (ref 0.1–1.0)
Monocytes Relative: 5 %
Neutro Abs: 7 10*3/uL (ref 1.7–7.7)
Neutrophils Relative %: 81 %
PLATELETS: 178 10*3/uL (ref 150–400)
RBC: 3.52 MIL/uL — AB (ref 3.87–5.11)
RDW: 16.4 % — ABNORMAL HIGH (ref 11.5–15.5)
WBC: 8.7 10*3/uL (ref 4.0–10.5)

## 2015-04-23 LAB — BASIC METABOLIC PANEL
Anion gap: 9 (ref 5–15)
BUN: 25 mg/dL — AB (ref 6–20)
CO2: 19 mmol/L — ABNORMAL LOW (ref 22–32)
CREATININE: 1.13 mg/dL — AB (ref 0.44–1.00)
Calcium: 8.9 mg/dL (ref 8.9–10.3)
Chloride: 113 mmol/L — ABNORMAL HIGH (ref 101–111)
GFR calc Af Amer: >60 mL/min (ref >60)
Glucose, Bld: 114 mg/dL — ABNORMAL HIGH (ref 65–99)
Potassium: 4.1 mmol/L (ref 3.5–5.1)
SODIUM: 141 mmol/L (ref 135–145)

## 2015-04-23 LAB — INFLUENZA PANEL BY PCR (TYPE A & B)
H1N1FLUPCR: NOT DETECTED
INFLAPCR: NEGATIVE
Influenza B By PCR: NEGATIVE

## 2015-04-23 NOTE — ED Notes (Signed)
Patient transported to X-ray 

## 2015-04-23 NOTE — Discharge Instructions (Signed)
Please read and follow all provided instructions.  Your diagnoses today include:  1. Viral syndrome    You appear to have an upper respiratory infection (URI). An upper respiratory tract infection, or cold, is a viral infection of the air passages leading to the lungs. It should improve gradually after 5-7 days. You may have a lingering cough that lasts for 2- 4 weeks after the infection.  Tests performed today include:  Vital signs. See below for your results today.   Medications prescribed:   Take any prescribed medications only as directed. Treatment for your infection is aimed at treating the symptoms. There are no medications, such as antibiotics, that will cure your infection.   Home care instructions:  Follow any educational materials contained in this packet.   Your illness is contagious and can be spread to others, especially during the first 3 or 4 days. It cannot be cured by antibiotics or other medicines. Take basic precautions such as washing your hands often, covering your mouth when you cough or sneeze, and avoiding public places where you could spread your illness to others.   Please continue drinking plenty of fluids.  Use over-the-counter medicines as needed as directed on packaging for symptom relief.  You may also use ibuprofen or tylenol as directed on packaging for pain or fever.  Do not take multiple medicines containing Tylenol or acetaminophen to avoid taking too much of this medication.  Follow-up instructions: Please follow-up with your primary care provider in the next 3 days for further evaluation of your symptoms if you are not feeling better.   Return instructions:   Please return to the Emergency Department if you experience worsening symptoms.   RETURN IMMEDIATELY IF you develop shortness of breath, confusion or altered mental status, a new rash, become dizzy, faint, or poorly responsive, or are unable to be cared for at home.  Please return if you have  persistent vomiting and cannot keep down fluids or develop a fever that is not controlled by tylenol or motrin.    Please return if you have any other emergent concerns.  Additional Information:  Your vital signs today were: BP 118/65 mmHg   Pulse 92   Temp(Src) 98.3 F (36.8 C) (Oral)   Resp 17   SpO2 100%   LMP 04/01/2015 (Approximate) If your blood pressure (BP) was elevated above 135/85 this visit, please have this repeated by your doctor within one month. --------------

## 2015-04-23 NOTE — ED Notes (Addendum)
Pt reports that she had fevers and headache over the past few days but they have improved. Her MD wanted her o come o ED for lab draw due to Lupus. Pt alert x4. NAD at this time.

## 2015-04-23 NOTE — ED Notes (Signed)
Patient alert and oriented at discharge, vital signs stable.

## 2015-04-23 NOTE — ED Provider Notes (Signed)
CSN: [id number]     Arrival date & time 04/23/15  2706 History   First MD Initiated Contact with Patient 04/23/15 [phone]     Chief Complaint  Patient presents with  . Headache   (Consider location/radiation/quality/duration/timing/severity/associated sxs/prior Treatment) HPI 20 y.o. female with a hx of Lupus, presents to the Emergency Department today complaining of headache and fever since 2 days ago. Upon further questioning with patient, she states that her headache and fever started on the 27th. She contacted her Rheumatologist at [facility] for assistance on what to do. They stated to get a Flu, CXR, Blood Cx and Urine Cx at PCP or ED if PCP unavailable. PT headache and fever went away yesterday after Ibuprofen. No headache symptoms currently. No N/V/D. No vision changes. No tinnitus. States she does have a cough. No sinus congestion. No CP/SOB/ABD pain. No other symptoms noted.   Past Medical History  Diagnosis Date  . Lupus (HCC)   . Mitral valve stenosis   . Hypertension   . Renal insufficiency   . Lupus [facility])    Past Surgical History  Procedure Laterality Date  . Value replacement    . Laparoscopic appendectomy N/A 02/11/2013    Procedure: APPENDECTOMY LAPAROSCOPIC;  Surgeon: Liz Malady, MD;  Location: [facility] OR;  Service: General;  Laterality: N/A;  . Laparoscopic salpingo oopherectomy Right 02/11/2013    Procedure: LAPAROSCOPIC SALPINGO OOPHORECTOMY;  Surgeon: Lazaro Arms, MD;  Location: [facility] OR;  Service: Gynecology;  Laterality: Right;  . Tonsillectomy    . Appendectomy  January 2015  . Esophagogastroduodenoscopy N/A 02/14/2013    Procedure: ESOPHAGOGASTRODUODENOSCOPY (EGD);  Surgeon: Hart Carwin, MD;  Location: [facility] ENDOSCOPY;  Service: Endoscopy;  Laterality: N/A;  . Cardiac surgery     Family History  Problem Relation Age of Onset  . Lupus Maternal Aunt    Social History  Substance Use Topics  . Smoking status: Never Smoker   . Smokeless tobacco: Never Used  . Alcohol  Use: No   OB History    No data available     Review of Systems ROS reviewed and all are negative for acute change except as noted in the HPI.  Allergies  Ciprofloxacin and Vancomycin  Home Medications   Prior to Admission medications   Medication Sig Start Date End Date Taking? Authorizing Provider  acetaminophen (TYLENOL) 325 MG tablet Take 650 mg by mouth every 6 (six) hours as needed for mild pain.    Historical Provider, MD  aspirin 81 MG tablet Take 81 mg by mouth daily.    Historical Provider, MD  atorvastatin (LIPITOR) 80 MG tablet Take 80 mg by mouth daily.    Historical Provider, MD  cephALEXin (KEFLEX) 500 MG capsule Take 1 capsule (500 mg total) by mouth 4 (four) times daily. 03/23/15   Cheri Fowler, PA-C  cyclophosphamide (CYTOXAN) 2 g chemo injection Inject into the vein once.    Historical Provider, MD  cyclophosphamide (CYTOXAN) 25 MG tablet Take 25 mg by mouth daily. Give on an empty stomach 1 hour before or 2 hours after meals.    Historical Provider, MD  ferrous sulfate 325 (65 FE) MG tablet Take 325 mg by mouth daily with breakfast.    Historical Provider, MD  lisinopril (PRINIVIL,ZESTRIL) 5 MG tablet Take 5 mg by mouth daily.    Historical Provider, MD  mycophenolate (MYFORTIC) 360 MG TBEC EC tablet Take 360 mg by mouth 2 (two) times daily. Reported on 03/03/2015    Historical Provider,  MD  NIFEdipine (PROCARDIA-XL/ADALAT CC) 60 MG 24 hr tablet Take 60 mg by mouth daily.    Historical Provider, MD  predniSONE (DELTASONE) 5 MG tablet Take 40 mg by mouth every other day.     Historical Provider, MD  warfarin (COUMADIN) 7.5 MG tablet Take 4 mg by mouth daily.     Historical Provider, MD   BP 111/56 mmHg  Pulse 106  Temp(Src) 98.3 F (36.8 C) (Oral)  Resp 18  SpO2 100%  LMP 04/01/2015 (Approximate)   Physical Exam  Constitutional: She is oriented to person, place, and time. She appears well-developed and well-nourished. No distress.  HENT:  Head: Normocephalic and  atraumatic.  Right Ear: Tympanic membrane, external ear and ear canal normal.  Left Ear: Tympanic membrane, external ear and ear canal normal.  Nose: Nose normal.  Mouth/Throat: Uvula is midline, oropharynx is clear and moist and mucous membranes are normal. No trismus in the jaw. No oropharyngeal exudate, posterior oropharyngeal erythema or tonsillar abscesses.  Eyes: EOM are normal. Pupils are equal, round, and reactive to light.  Neck: Normal range of motion. Neck supple. No tracheal deviation present.  Cardiovascular: Normal rate, regular rhythm, S1 normal, S2 normal, normal heart sounds, intact distal pulses and normal pulses.   Pulmonary/Chest: Effort normal and breath sounds normal. No respiratory distress. She has no decreased breath sounds. She has no wheezes. She has no rhonchi. She has no rales.  Abdominal: Normal appearance and bowel sounds are normal. There is no tenderness.  Musculoskeletal: Normal range of motion.  Neurological: She is alert and oriented to person, place, and time. She has normal strength. No cranial nerve deficit or sensory deficit.  Cranial Nerves:  II: Pupils equal, round, reactive to light III,IV, VI: ptosis not present, extra-ocular motions intact bilaterally  V,VII: smile symmetric, facial light touch sensation equal VIII: hearing grossly normal bilaterally  IX,X: midline uvula rise  XI: bilateral shoulder shrug equal and strong XII: midline tongue extension  Skin: Skin is warm and dry.  Psychiatric: She has a normal mood and affect. Her speech is normal and behavior is normal. Thought content normal.   ED Course  Procedures (including critical care time) Labs Review Labs Reviewed  BASIC METABOLIC PANEL - Abnormal; Notable for the following:    Chloride 113 (*)    CO2 19 (*)    Glucose, Bld 114 (*)    BUN 25 (*)    Creatinine, Ser 1.13 (*)    All other components within normal limits  CBC WITH DIFFERENTIAL/PLATELET - Abnormal; Notable for the  following:    RBC 3.52 (*)    Hemoglobin 10.6 (*)    HCT 34.4 (*)    RDW 16.4 (*)    All other components within normal limits  INFLUENZA PANEL BY PCR (TYPE A & B, H1N1)    Imaging Review Dg Chest 2 View  04/23/2015  CLINICAL DATA:  Fever, cough. EXAM: CHEST  2 VIEW COMPARISON:  January 26, 2015. FINDINGS: The heart size and mediastinal contours are within normal limits. Both lungs are clear. Status post cardiac valve repair. No pneumothorax or pleural effusion is noted. The visualized skeletal structures are unremarkable. IMPRESSION: No active cardiopulmonary disease. Electronically Signed   By: Lupita Raider, M.D.   On: 04/23/2015 09:23   I have personally reviewed and evaluated these images and lab results as part of my medical decision-making.   EKG Interpretation None      MDM  I have reviewed and evaluated  the relevant laboratory values. I have reviewed and evaluated the relevant imaging studies. I have reviewed the relevant previous healthcare records. I obtained HPI from historian. Patient discussed with supervising physician  ED Course:  Assessment: Pt is a 20yF with hx Lupus who presents with headache, fever, cough since the 27th. No headache or fever now. Went way with Ibuprofen yesterday. On exam, pt in NAD. Well appearing. Sitting comfortably. Nontoxic/nonseptic appearing. VSS. Afebrile. Lungs CTA. Heart RRR. Abdomen nontender soft. CN evaluated an unremarkable. No visual changes. No motor/sensory deficits. Labs unremarkable. CXR shows no acute infiltrate. Plan is to DC home with follow up to PCP for further management. Most likely viral in etiology. At time of discharge, Patient is in no acute distress. Vital Signs are stable. Patient is able to ambulate. Patient able to tolerate PO.   Disposition/Plan:  DC Home Additional Verbal discharge instructions given and discussed with patient.  Pt Instructed to f/u with PCP in the next 48 hours for evaluation and treatment of  symptoms. Return precautions given Pt acknowledges and agrees with plan  Supervising Physician Vanetta Mulders, MD   Final diagnoses:  Viral syndrome      Audry Pili, PA-C 04/23/15 [id number]  Vanetta Mulders, MD 04/23/15 1028

## 2015-07-21 ENCOUNTER — Encounter (HOSPITAL_COMMUNITY): Payer: Self-pay

## 2015-07-21 ENCOUNTER — Emergency Department (HOSPITAL_COMMUNITY)
Admission: EM | Admit: 2015-07-21 | Discharge: 2015-07-21 | Disposition: A | Discharge status: Home / Self Care | Payer: Self-pay | Attending: Emergency Medicine | Admitting: Emergency Medicine

## 2015-07-21 ENCOUNTER — Emergency Department (HOSPITAL_COMMUNITY): Payer: Self-pay

## 2015-07-21 DIAGNOSIS — I10 Essential (primary) hypertension: Secondary | ICD-10-CM | POA: Insufficient documentation

## 2015-07-21 DIAGNOSIS — R51 Headache: Secondary | ICD-10-CM | POA: Insufficient documentation

## 2015-07-21 DIAGNOSIS — I059 Rheumatic mitral valve disease, unspecified: Secondary | ICD-10-CM

## 2015-07-21 DIAGNOSIS — R519 Headache, unspecified: Secondary | ICD-10-CM

## 2015-07-21 DIAGNOSIS — Z7982 Long term (current) use of aspirin: Secondary | ICD-10-CM | POA: Insufficient documentation

## 2015-07-21 DIAGNOSIS — Z7901 Long term (current) use of anticoagulants: Secondary | ICD-10-CM | POA: Insufficient documentation

## 2015-07-21 LAB — HEPATIC FUNCTION PANEL
ALBUMIN: 3.1 g/dL — AB (ref 3.5–5.0)
ALT: 23 U/L (ref 14–54)
AST: 20 U/L (ref 15–41)
Alkaline Phosphatase: 55 U/L (ref 38–126)
BILIRUBIN TOTAL: 0.8 mg/dL (ref 0.3–1.2)
Bilirubin, Direct: <0.1 mg/dL — ABNORMAL LOW (ref 0.1–0.5)
TOTAL PROTEIN: 5.6 g/dL — AB (ref 6.5–8.1)

## 2015-07-21 LAB — CBC
HCT: 35 % — ABNORMAL LOW (ref 36.0–46.0)
Hemoglobin: 11.1 g/dL — ABNORMAL LOW (ref 12.0–15.0)
MCH: 29.8 pg (ref 26.0–34.0)
MCHC: 31.7 g/dL (ref 30.0–36.0)
MCV: 94.1 fL (ref 78.0–100.0)
Platelets: 215 10*3/uL (ref 150–400)
RBC: 3.72 MIL/uL — ABNORMAL LOW (ref 3.87–5.11)
RDW: 13.1 % (ref 11.5–15.5)
WBC: 6.3 10*3/uL (ref 4.0–10.5)

## 2015-07-21 LAB — BASIC METABOLIC PANEL
Anion gap: 5 (ref 5–15)
BUN: 16 mg/dL (ref 6–20)
CALCIUM: 8.7 mg/dL — AB (ref 8.9–10.3)
CO2: 23 mmol/L (ref 22–32)
Chloride: 112 mmol/L — ABNORMAL HIGH (ref 101–111)
Creatinine, Ser: 1.19 mg/dL — ABNORMAL HIGH (ref 0.44–1.00)
GFR calc Af Amer: >60 mL/min (ref >60)
GLUCOSE: 206 mg/dL — AB (ref 65–99)
Potassium: 3.6 mmol/L (ref 3.5–5.1)
Sodium: 140 mmol/L (ref 135–145)

## 2015-07-21 LAB — PROTIME-INR
INR: 1.15 (ref 0.00–1.49)
PROTHROMBIN TIME: 14.9 s (ref 11.6–15.2)

## 2015-07-21 MED ORDER — WARFARIN SODIUM 7.5 MG PO TABS: 7.5000 mg | ORAL_TABLET | Freq: Every day | ORAL | Status: AC

## 2015-07-21 MED ORDER — ACETAMINOPHEN 325 MG PO TABS
975.0000 mg | ORAL_TABLET | Freq: Once | ORAL | Status: AC
  Administered 2015-07-21: 975 mg via ORAL
  Filled 2015-07-21: qty 3

## 2015-07-21 MED ORDER — ENOXAPARIN SODIUM 80 MG/0.8ML ~~LOC~~ SOLN
80.0000 mg | Freq: Two times a day (BID) | SUBCUTANEOUS | Status: DC
  Administered 2015-07-21: 80 mg via SUBCUTANEOUS
  Filled 2015-07-21: qty 0.8

## 2015-07-21 MED ORDER — CEPHALEXIN 250 MG PO CAPS: 500.0000 mg | ORAL_CAPSULE | Freq: Once | ORAL | Status: DC

## 2015-07-21 NOTE — Progress Notes (Signed)
ANTICOAGULATION CONSULT NOTE - Initial Consult  Pharmacy Consult for lovenox Indication: mechanical valve  Allergies  Allergen Reactions  . Ciprofloxacin Hives  . Vancomycin Hives    Patient Measurements: Height: 5\' 2"  (157.5 cm) Weight: 186 lb (84.369 kg) IBW/kg (Calculated) : 50.1  Vital Signs: Temp: 98.8 F (37.1 C) (06/26 0917) Temp Source: Oral (06/26 0917) BP: 104/50 mmHg (06/26 1430) Pulse Rate: 101 (06/26 1430)  Labs:  Recent Labs  07/21/15 0948  LABPROT 14.9  INR 1.15    CrCl cannot be calculated (Patient has no serum creatinine result on file.).   Medical History: Past Medical History  Diagnosis Date  . Lupus (HCC)   . Mitral valve stenosis   . Hypertension   . Renal insufficiency   . Lupus (HCC)    Assessment: 20 yof on chronic coumadin for history of a mechanical valve but INR is subtherapeutic so planning on discharging on lovenox until INR is at goal. Dose rounded down slightly for ease of outpatient administration.   Goal of Therapy:  Monitor platelets by anticoagulation protocol: Yes   Plan:  - Lovenox 80mg  SQ Q12H - continue until INR is therapeutic - F/u CBC and renal fxn if admitted  Dayden Viverette, 07/23/15 07/21/2015,3:09 PM

## 2015-07-21 NOTE — ED Provider Notes (Signed)
CSN: [id number]     Arrival date & time 07/21/15  0840 History   First MD Initiated Contact with Patient 07/21/15 [phone]     Chief Complaint  Patient presents with  . Headache     (Consider location/radiation/quality/duration/timing/severity/associated sxs/prior Treatment) HPI Complains of occipital headache onset 2 nights ago. Similar to headaches she gets every few months, this one lasting longer. She treated self with Tylenol last dose 1 AM today which alleviated pain for a few hours. Presently headache is mild no other associated symptoms no fever no nausea or vomiting no trauma no visual changes. Past Medical History  Diagnosis Date  . Lupus (HCC)   . Mitral valve stenosis   . Hypertension   . Renal insufficiency   . Lupus [facility])    Past Surgical History  Procedure Laterality Date  . Value replacement    . Laparoscopic appendectomy N/A 02/11/2013    Procedure: APPENDECTOMY LAPAROSCOPIC;  Surgeon: Liz Malady, MD;  Location: [facility] OR;  Service: General;  Laterality: N/A;  . Laparoscopic salpingo oopherectomy Right 02/11/2013    Procedure: LAPAROSCOPIC SALPINGO OOPHORECTOMY;  Surgeon: Lazaro Arms, MD;  Location: [facility] OR;  Service: Gynecology;  Laterality: Right;  . Tonsillectomy    . Appendectomy  January 2015  . Esophagogastroduodenoscopy N/A 02/14/2013    Procedure: ESOPHAGOGASTRODUODENOSCOPY (EGD);  Surgeon: Hart Carwin, MD;  Location: [facility] ENDOSCOPY;  Service: Endoscopy;  Laterality: N/A;  . Cardiac surgery     Family History  Problem Relation Age of Onset  . Lupus Maternal Aunt    Social History  Substance Use Topics  . Smoking status: Never Smoker   . Smokeless tobacco: Never Used  . Alcohol Use: No   OB History    Gravida Para Term Preterm AB TAB SAB Ectopic Multiple Living   0 0 0 0 0 0 0 0 0 0      Review of Systems  Constitutional: Negative.   Respiratory: Negative.   Cardiovascular: Negative.   Gastrointestinal: Negative.   Musculoskeletal: Negative.    Skin: Negative.   Neurological: Positive for headaches.  Psychiatric/Behavioral: Negative.   All other systems reviewed and are negative.     Allergies  Ciprofloxacin and Vancomycin  Home Medications   Prior to Admission medications   Medication Sig Start Date End Date Taking? Authorizing Provider  acetaminophen (TYLENOL) 325 MG tablet Take 650 mg by mouth every 6 (six) hours as needed for mild pain.    Historical Provider, MD  aspirin 81 MG tablet Take 81 mg by mouth daily.    Historical Provider, MD  atorvastatin (LIPITOR) 80 MG tablet Take 80 mg by mouth daily.    Historical Provider, MD  cephALEXin (KEFLEX) 500 MG capsule Take 1 capsule (500 mg total) by mouth 4 (four) times daily. 03/23/15   Cheri Fowler, PA-C  cyclophosphamide (CYTOXAN) 2 g chemo injection Inject into the vein once.    Historical Provider, MD  cyclophosphamide (CYTOXAN) 25 MG tablet Take 25 mg by mouth daily. Give on an empty stomach 1 hour before or 2 hours after meals.    Historical Provider, MD  ferrous sulfate 325 (65 FE) MG tablet Take 325 mg by mouth daily with breakfast.    Historical Provider, MD  lisinopril (PRINIVIL,ZESTRIL) 5 MG tablet Take 5 mg by mouth daily.    Historical Provider, MD  mycophenolate (MYFORTIC) 360 MG TBEC EC tablet Take 360 mg by mouth 2 (two) times daily. Reported on 03/03/2015    Historical Provider, MD  NIFEdipine (PROCARDIA-XL/ADALAT CC) 60 MG 24 hr tablet Take 60 mg by mouth daily.    Historical Provider, MD  predniSONE (DELTASONE) 5 MG tablet Take 40 mg by mouth every other day.     Historical Provider, MD  warfarin (COUMADIN) 2 MG tablet Take 4 mg by mouth daily.    Historical Provider, MD  warfarin (COUMADIN) 7.5 MG tablet Take 4 mg by mouth daily.     Historical Provider, MD   BP 149/109 mmHg  Pulse 93  Temp(Src) 98.8 F (37.1 C) (Oral)  Ht 5\' 2"  (1.575 m)  Wt 186 lb (84.369 kg)  BMI 34.01 kg/m2  SpO2 99%  LMP 06/30/2015 Physical Exam  Constitutional: She is oriented  to person, place, and time. She appears well-developed and well-nourished. No distress.  HENT:  Head: Normocephalic and atraumatic.  Eyes: Conjunctivae are normal. Pupils are equal, round, and reactive to light.  Neck: Neck supple. No tracheal deviation present. No thyromegaly present.  Cardiovascular: Normal rate and regular rhythm.   No murmur heard. Pulmonary/Chest: Effort normal and breath sounds normal.  Abdominal: Soft. Bowel sounds are normal. She exhibits no distension. There is no tenderness.  Obese  Musculoskeletal: Normal range of motion. She exhibits no edema or tenderness.  Neurological: She is alert and oriented to person, place, and time. No cranial nerve deficit. Coordination normal.  Gait normal Romberg normal pronator drift normal finger to nose normal DTRs symmetric bilaterally at knee jerk ankle jerk and biceps toes downward going bilaterally  Skin: Skin is warm and dry. No rash noted.  Psychiatric: She has a normal mood and affect.  Nursing note and vitals reviewed.   ED Course  Procedures (including critical care time) Labs Review Labs Reviewed - No data to display  Imaging Review No results found. I have personally reviewed and evaluated these images and lab results as part of my medical decision-making.   EKG Interpretation None     12 noon patient resting comfortably. No distress. 435 p.m. patient resting comfortably states headache almost gone Cardiology was consulted for subtherapeutic INR in light of mechanical valve.PM take it Results for orders placed or performed during the hospital encounter of 07/21/15  Protime-INR  Result Value Ref Range   Prothrombin Time 14.9 11.6 - 15.2 seconds   INR 1.15 0.00 - 1.49  Basic metabolic panel  Result Value Ref Range   Sodium 140 135 - 145 mmol/L   Potassium 3.6 3.5 - 5.1 mmol/L   Chloride 112 (H) 101 - 111 mmol/L   CO2 23 22 - 32 mmol/L   Glucose, Bld 206 (H) 65 - 99 mg/dL   BUN 16 6 - 20 mg/dL    Creatinine, Ser 07/23/15 (H) 0.44 - 1.00 mg/dL   Calcium 8.7 (L) 8.9 - 10.3 mg/dL   GFR calc non Af Amer >60 >60 mL/min   GFR calc Af Amer >60 >60 mL/min   Anion gap 5 5 - 15  CBC  Result Value Ref Range   WBC 6.3 4.0 - 10.5 K/uL   RBC 3.72 (L) 3.87 - 5.11 MIL/uL   Hemoglobin 11.1 (L) 12.0 - 15.0 g/dL   HCT 0.93 (L) 81.8 - 29.9 %   MCV 94.1 78.0 - 100.0 fL   MCH 29.8 26.0 - 34.0 pg   MCHC 31.7 30.0 - 36.0 g/dL   RDW 37.1 69.6 - 78.9 %   Platelets 215 150 - 400 K/uL  Hepatic function panel  Result Value Ref Range   Total Protein  5.6 (L) 6.5 - 8.1 g/dL   Albumin 3.1 (L) 3.5 - 5.0 g/dL   AST 20 15 - 41 U/L   ALT 23 14 - 54 U/L   Alkaline Phosphatase 55 38 - 126 U/L   Total Bilirubin 0.8 0.3 - 1.2 mg/dL   Bilirubin, Direct <5.8 (L) 0.1 - 0.5 mg/dL   Indirect Bilirubin NOT CALCULATED 0.3 - 0.9 mg/dL   Ct Head Wo Contrast  07/21/2015  CLINICAL DATA:  Headache since Saturday. EXAM: CT HEAD WITHOUT CONTRAST TECHNIQUE: Contiguous axial images were obtained from the base of the skull through the vertex without intravenous contrast. COMPARISON:  04/22/2013 FINDINGS: No acute intracranial abnormality. Specifically, no hemorrhage, hydrocephalus, mass lesion, acute infarction, or significant intracranial injury. No acute calvarial abnormality. Visualized paranasal sinuses and mastoids clear. Orbital soft tissues unremarkable. IMPRESSION: No acute intracranial abnormality. Electronically Signed   By: Charlett Nose M.D.   On: 07/21/2015 10:05    MDM  Cardiology made arrangements for outpatient treatment and follow-up with Coumadin clinic and for Lovenox Final diagnoses:  None   #1 nonspecific headache #2 subtherapeutic INR      Doug Sou, MD 07/21/15 1641

## 2015-07-21 NOTE — ED Notes (Signed)
Patient transported to CT 

## 2015-07-21 NOTE — Consult Note (Signed)
CARDIOLOGY CONSULT NOTE   Patient ID: Joan Miles MRN: [id number], DOB/AGE: 15-Aug-1995   Admit date: 07/21/2015 Date of Consult: 07/21/2015   Primary Physician: Velora Heckler, MD Primary Cardiologist: Dr. Deatra James with [facility]  Pt. 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 Noted to have subtherapeutic INR despite mechanical mitral valve  Problem List  Past Medical History  Diagnosis Date  . Lupus (HCC)   . Mitral valve stenosis   . Hypertension   . Renal insufficiency   . Lupus [facility])     Past Surgical History  Procedure Laterality Date  . Value replacement    . Laparoscopic appendectomy N/A 02/11/2013    Procedure: APPENDECTOMY LAPAROSCOPIC;  Surgeon: Liz Malady, MD;  Location: [facility] OR;  Service: General;  Laterality: N/A;  . Laparoscopic salpingo oopherectomy Right 02/11/2013    Procedure: LAPAROSCOPIC SALPINGO OOPHORECTOMY;  Surgeon: Lazaro Arms, MD;  Location: [facility] OR;  Service: Gynecology;  Laterality: Right;  . Tonsillectomy    . Appendectomy  January 2015  . Esophagogastroduodenoscopy N/A 02/14/2013    Procedure: ESOPHAGOGASTRODUODENOSCOPY (EGD);  Surgeon: Hart Carwin, MD;  Location: [facility] ENDOSCOPY;  Service: Endoscopy;  Laterality: N/A;  . Cardiac surgery       Allergies  Allergies  Allergen Reactions  . Ciprofloxacin Hives  . 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 She has been followed by Dr. Orie Rout office as well as [facility] pediatric rheumatology service. Based on note by  Dr. Mayer Camel on 05/28/2014 patient underwent transesophageal echocardiogram on 11/13/2010 showing mitral valve changes consistent with Libman-Sacks endocarditis, severe mitral stenosis, moderate severe mitral regurg, moderately severe left atrial dilatation. During the last office visit in May 2016, Dr. Mayer Camel pointed out the patient had a dilated LAD on echo who discussed the finding with Dr. Deatra James.  After mechanical mitral valve prosthesis, patient had been doing relatively well. She did have a fluoroscopy on 10/08/2011 which showed normal prosthetic mitral valve leaflet motion. Last echocardiogram performed on 02/14/2015 at [facility] showed dilatation of left anterior descending arteries, proximal LAD measuring 8.6 mm in diameter, distal LAD measures 10.1 mm, prosthetic mitral valve with 5-7 mmHg mean gradient, mild tricuspid valve insufficiency with 0.9 mmHg peak gradient, normal biventricular systolic function. It appears she had a resting myocardial perfusion imaging on 03/03/2015 which showed diminished activity in the lateral wall which may be related to soft tissue attenuation. Otherwise she has been doing well at home. Her last INR was in February 2017 which was 2.7 therapeutic. The last time she checked her INR was 07/07/2015, it was noted her INR was low 1.2. She was instructed to take 10 mg Coumadin for that day and started on the following day take 7.5 mg Coumadin on Monday, Wednesday, and Friday and 5 mg Coumadin for all other days. Per patient, she has been compliant with her medications at home including the Coumadin. She however has been trying to eat healthier and has been eating more salads recently. She was instructed to recheck INR after one week from the  last lab, however it does not appear she has done so.   Patient presented to [facility] for evaluation of posterior headache on 07/21/2015 this started on Saturday. The headache has not went away, she denies any  ipsilateral weakness or slurring of speech. She denies any shortness of breath, chest discomfort, or dizziness. Initial CT of the head was negative for acute process. INR however was 1.15 essentially unchanged from her previous INR level despite taking high dose of Coumadin. Cardiology has been consulted for the recommendation regarding Coumadin in the setting of mechanical mitral valve prosthesis.    No current facility-administered medications on file prior to encounter.   Current Outpatient Prescriptions on File Prior to Encounter  Medication Sig Dispense Refill  . acetaminophen (TYLENOL) 325 MG tablet Take 650 mg by mouth every 6 (six) hours as needed for mild pain.    Marland Kitchen aspirin 81 MG tablet Take 81 mg by mouth daily.    Marland Kitchen atorvastatin (LIPITOR) 80 MG tablet Take 80 mg by mouth daily.    . cyclophosphamide (CYTOXAN) 2 g chemo injection Inject 2,000 mg into the vein every 30 (thirty) days.     . ferrous sulfate 325 (65 FE) MG tablet Take 325 mg by mouth daily with breakfast.    . lisinopril (PRINIVIL,ZESTRIL) 5 MG tablet Take 5 mg by mouth daily.    . mycophenolate (MYFORTIC) 360 MG TBEC EC tablet Take 360 mg by mouth 2 (two) times daily. Reported on 03/03/2015    . NIFEdipine (PROCARDIA-XL/ADALAT CC) 60 MG 24 hr tablet Take 60 mg by mouth daily.    . predniSONE (DELTASONE) 5 MG tablet Take 10 mg by mouth daily.     Marland Kitchen warfarin (COUMADIN) 7.5 MG tablet Take 7.5 mg by mouth every other day. Alternating with 5mg     . cephALEXin (KEFLEX) 500 MG capsule Take 1 capsule (500 mg total) by mouth 4 (four) times daily. (Patient not taking: Reported on 07/21/2015) 20 capsule 0       Family History Family History  Problem Relation Age of Onset  . Lupus Maternal Aunt      Social History Social History   Social History  . Marital Status: Single    Spouse Name: N/A  . Number of Children: N/A  . Years of Education: N/A   Occupational History  . Not on file.   Social History Main Topics  .  Smoking status: Never Smoker   . Smokeless tobacco: Never Used  . Alcohol Use: No  . Drug Use: No  . Sexual Activity: Not on file   Other Topics Concern  . Not on file   Social History Narrative     Review of Systems  General:  No chills, fever, night sweats or weight changes.  Cardiovascular:  No chest pain, dyspnea on exertion, edema, orthopnea, palpitations, paroxysmal nocturnal dyspnea. Dermatological: No rash, lesions/masses Respiratory: No cough, dyspnea Urologic: No hematuria, dysuria Abdominal:   No nausea, vomiting, diarrhea, bright red blood per rectum, melena, or hematemesis Neurologic:  No visual changes, wkns, changes in mental status. +headache All other systems reviewed and are otherwise negative except as noted above.  Physical Exam  Blood pressure 126/88, pulse 91, temperature 98.8 F (37.1 C), temperature source Oral, resp. rate 16, height 5\' 2"  (1.575 m), weight 186 lb (84.369 kg), last menstrual period 06/30/2015, SpO2 98 %.  General: Pleasant, NAD Psych: Normal affect. Neuro: Alert and oriented X 3. Moves all extremities spontaneously. HEENT: Normal  Neck: Supple without  bruits or JVD. Lungs:  Resp regular and unlabored, CTA. Heart: RRR no s3, s4. Mechanical click at apex. Abdomen: Soft, non-tender, non-distended, BS + x 4.  Extremities: No clubbing, cyanosis or edema. DP/PT/Radials 2+ and equal bilaterally.  Labs  No results for input(s): CKTOTAL, CKMB, TROPONINI in the last 72 hours. Lab Results  Component Value Date   WBC 8.7 04/23/2015   HGB 10.6* 04/23/2015   HCT 34.4* 04/23/2015   MCV 97.7 04/23/2015   PLT 178 04/23/2015   No results for input(s): NA, K, CL, CO2, BUN, CREATININE, CALCIUM, PROT, BILITOT, ALKPHOS, ALT, AST, GLUCOSE in the last 168 hours.  Invalid input(s): LABALBU No results found for: CHOL, HDL, LDLCALC, TRIG Lab Results  Component Value Date   DDIMER 3.25* 07/22/2014    Radiology/Studies  Ct Head Wo  Contrast  07/21/2015  CLINICAL DATA:  Headache since Saturday. EXAM: CT HEAD WITHOUT CONTRAST TECHNIQUE: Contiguous axial images were obtained from the base of the skull through the vertex without intravenous contrast. COMPARISON:  04/22/2013 FINDINGS: No acute intracranial abnormality. Specifically, no hemorrhage, hydrocephalus, mass lesion, acute infarction, or significant intracranial injury. No acute calvarial abnormality. Visualized paranasal sinuses and mastoids clear. Orbital soft tissues unremarkable. IMPRESSION: No acute intracranial abnormality. Electronically Signed   By: Charlett Nose M.D.   On: 07/21/2015 10:05    ECG  None  ASSESSMENT AND PLAN  1. Subtherapeutic INR  - She has been compliant with coumadin, INR goal 2.5-3.5 with mechanical St Jude Mitral valve  - will discuss with MD, she has been having increased salad intake, I wonder if this is enough to cause this degree of subtherapeutic INR. Patient is adament she has been taking coumadin.   - she understands the risk of stroke with missed doses of coumadin  - likely coumadin with Lovenox bridge, pending Cr before deciding on the dosage by pharmacy. Need close outpatient coumadin clinic followup, likely on a weekly basis until her INR stabilize.   Addendum: Dr. Excell Seltzer discussed with her coumadin clinic and have following recommendation   1. Bridge coumadin with lovenox. Will do lovenox per pharmacy to figure out dose and discuss with case manager to figure out insurance   2. Instruct patient to take 10mg  coumadin today, then start 7.5mg  daily   3. Check PT/INR at [facility] this Thur and have result send to her coumadin clinic and close outpatient followup with coumadin clinic  2. 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   3. Posterior headache: no ipsilateral weakness, CT of head negative for stroke. She is at higher  risk of thromboebalic event given subtherapeutic INR  4. SLE: followed by Pediatric Rheumatology at [facility]  5. history of glomerulonephritis: pending renal function   Signed, [facility], PA-C 07/21/2015, 2:34 PM  Patient seen, examined. Available data reviewed. Agree with findings, assessment, and plan as outlined by 07/23/2015, PA-C. The patient is independently interviewed and examined. She isn't alert nor an account woman in no distress. JVP is normal. Lungs are clear to auscultation bilaterally. Heart is regular rate and rhythm with a normal mechanical S1 and no murmur. Abdomen soft and nontender. Extremities are without edema.  Long discussion with the patient and her mother. The patient states she has been taking her Coumadin every day. Despite that, her last INR was 1.2 and today it is 1.15. She has been subtherapeutic now for  2 weeks based on my review of the lab data. I spoke with the Coumadin clinic at [facility] where she is followed. I think she should be covered with Lovenox because of a mechanical mitral valve and known subtherapeutic INR now for at least 2 weeks. Recommend the following   Lovenox per pharmacy - teaching and outpatient administration need to be secured prior to discharge from the ER  Coumadin 10 mg today then 7.5 mg daily starting tomorrow  INR this Thursday at [facility] as she has been doing with management through [facility].  She is counseled to reduce/avoid green leafy vegetables  The patient otherwise appears stable and her headache has been evaluated by the Emergency Room physician. There are no acute cardiopulmonary issues.   Tonny Bollman, M.D. 07/21/2015 4:16 PM

## 2015-07-21 NOTE — ED Notes (Signed)
Pt and Mother state they understand instructions. Teach back performed and visual aids given for lovenox injection teaching.

## 2015-07-21 NOTE — Progress Notes (Signed)
Spoke to patient regarding primary care resources and the [facility] orange card. Patient states she has been followed with care in the past by [facility] but has not been in some time due to her loss of insurance. Follow up appointment made with patients pcp at [facility] for Friday August 01, 2015 @ 4:15pm, pt verbalized understanding. Orange card application also provided and explained. Patient was instructed to contact me before the upcoming appointment with her pcp to obtain the orange card. My contact information provided for any future questions or concerns. No other [facility] & Eligibility Specialist needs identified at this time.  Buddy Duty [facility] & EligIbility Specialist [facility] [phone]

## 2015-07-21 NOTE — Discharge Instructions (Signed)
We have discussed with your coumadin clinic who has following recommendations for you:  Take 10mg  coumadin today, then start 7.5mg  daily tomorrow  Check PT/INR at [facility] this Thursday and have result send to your coumadin clinic and close outpatient followup with coumadin clinic

## 2015-07-21 NOTE — ED Notes (Signed)
Pt arrives Ambulatory with with c/o posterior headache since Saturday. Denies cough, congestion, photophobia but states pain increased when lying down or position change.

## 2015-07-21 NOTE — ED Notes (Signed)
Lovenox injection teaching performed for mother and patient. Lovenox teaching kit supplied to pt and information reviewed at length. Pharmacist at bedside to reinforce teaching. Teach back performed by pt and mother.

## 2015-07-21 NOTE — ED Notes (Signed)
MD at bedside. 

## 2015-07-22 NOTE — Discharge Planning (Signed)
Late Entry:  ED CM consulted by EDP for medication assistance  NCM reviewed chart review information and spoke with the pt about [facility] MATCH program ($3 co pay for each Rx through [facility] program, does not include refills, 7 day expiration of MATCH letter and choice of pharmacies). Pt is eligible for [facility] MATCH program (unable to find pt listed in PDMI per cardholder name inquiry) and has agreed to accept MATCH under terms discussed. PDMI information entered. MATCH letter completed and provided to pt.  NCM updated EDP and ED RN.   NCM also confirmed that pt does not have PCP. NCM discussed and provided written information for uninsured PCP and the importance of PCP for f/u care.

## 2015-08-01 ENCOUNTER — Ambulatory Visit: Payer: Medicaid Other | Admitting: Student

## 2015-08-01 ENCOUNTER — Encounter: Payer: Self-pay | Admitting: Student

## 2018-02-13 IMAGING — CT CT HEAD W/O CM
3 of 4 series · 18 of 47 positions shown, 21 images · non-contrast
Comparison: 04/22/2013

CLINICAL DATA: Headache since [REDACTED].

EXAM:
CT HEAD WITHOUT CONTRAST
TECHNIQUE: Contiguous axial images were obtained from the base of the skull
through the vertex without intravenous contrast.

[Series 201: head w/o, idose (1) · axial · non-contrast · 0.49mm/px · z∈[+127,+247]mm · 12 of 30 slices shown, 15 images]
[im 3/30  brain]
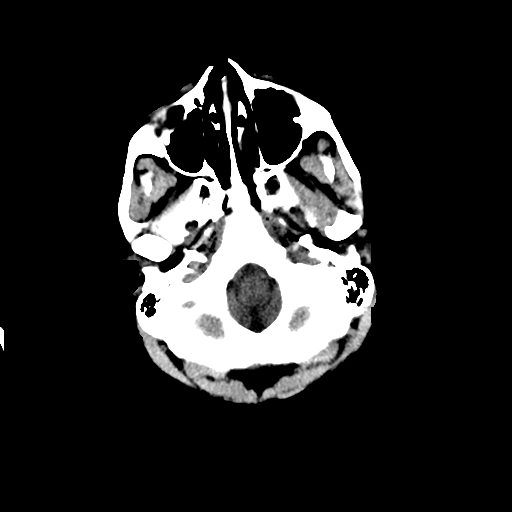
[im 3/30  bone]
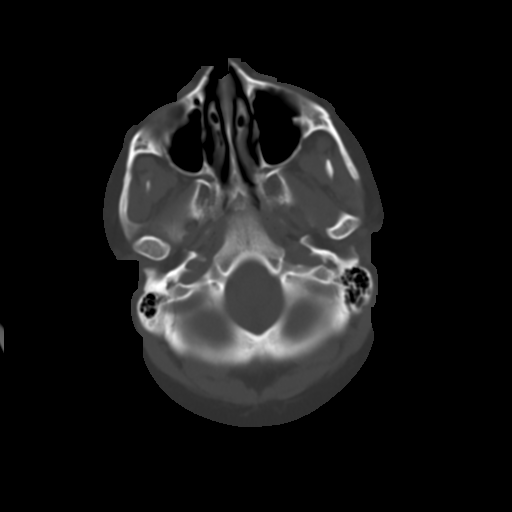
[im 5/30  brain]
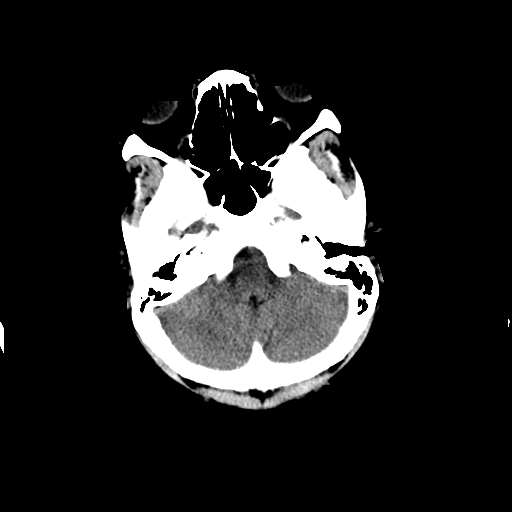
[im 7/30  brain]
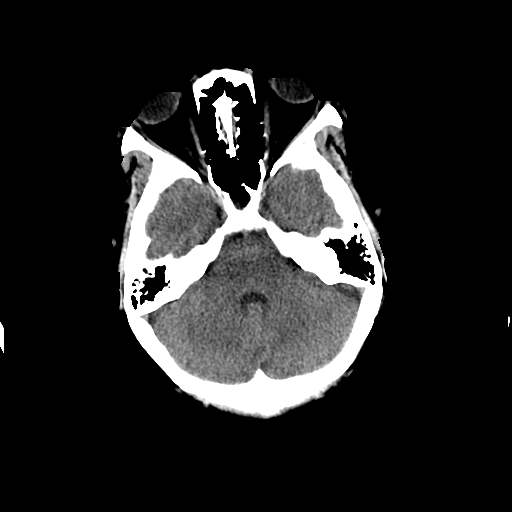
[im 9/30  brain]
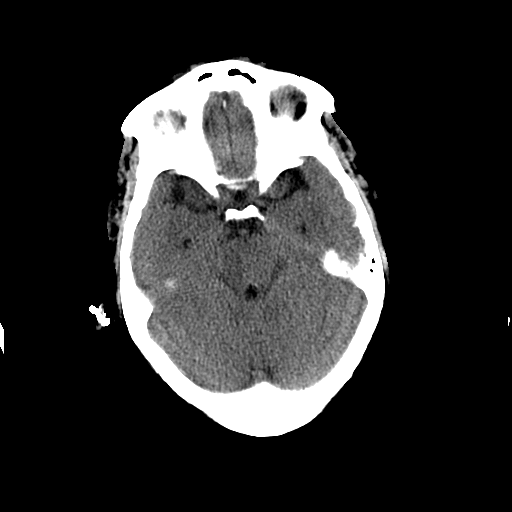
[im 11/30  brain]
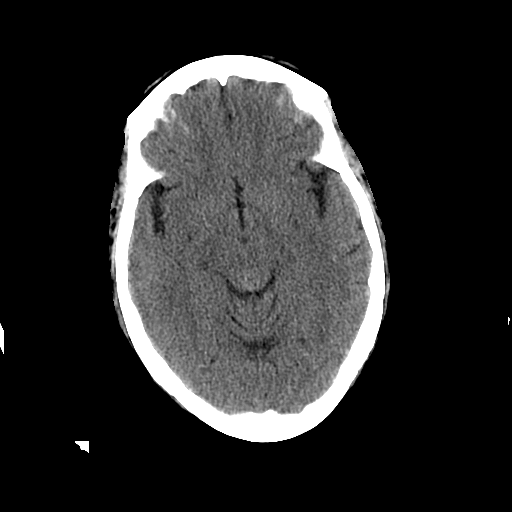
[im 11/30  bone]
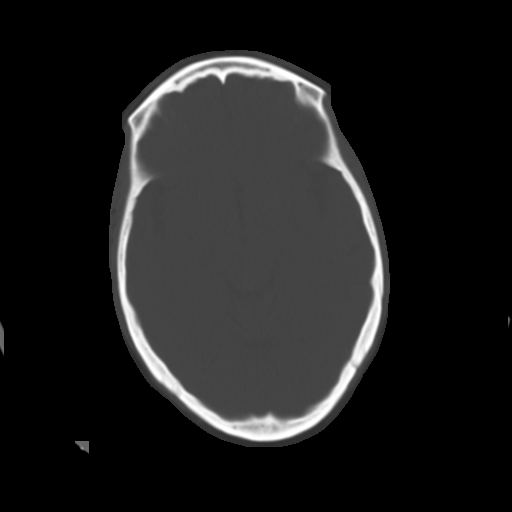
[im 13/30  brain]
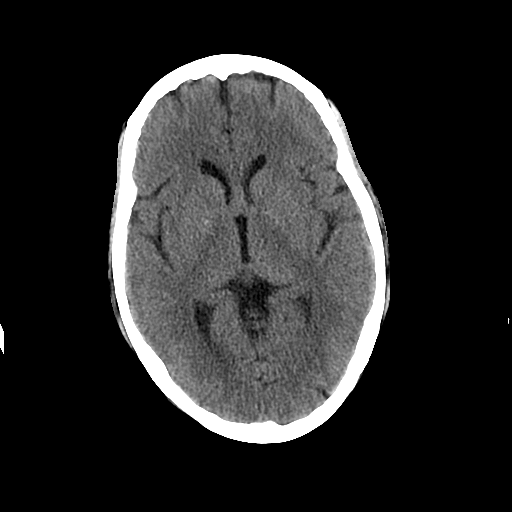
[im 17/30  brain]
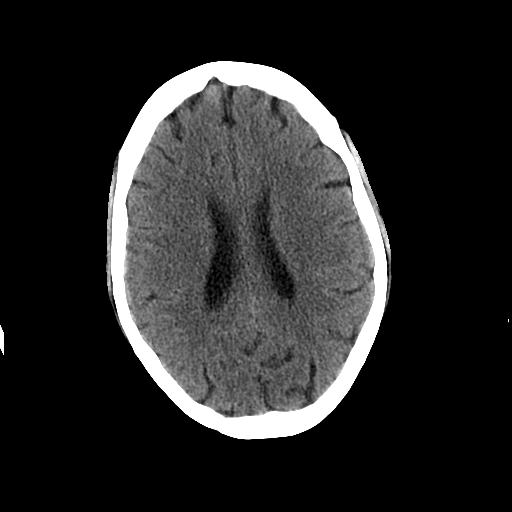
[im 19/30  brain]
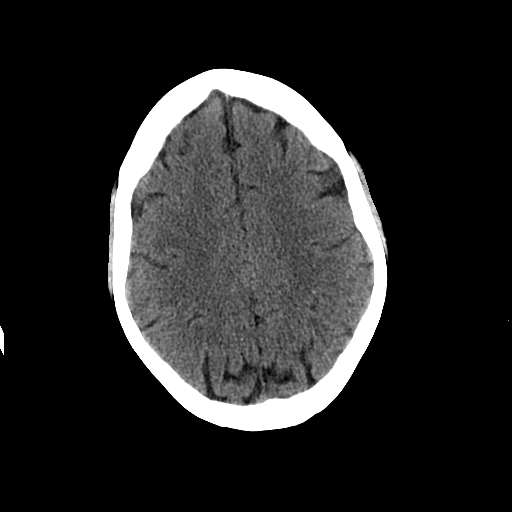
[im 21/30  brain]
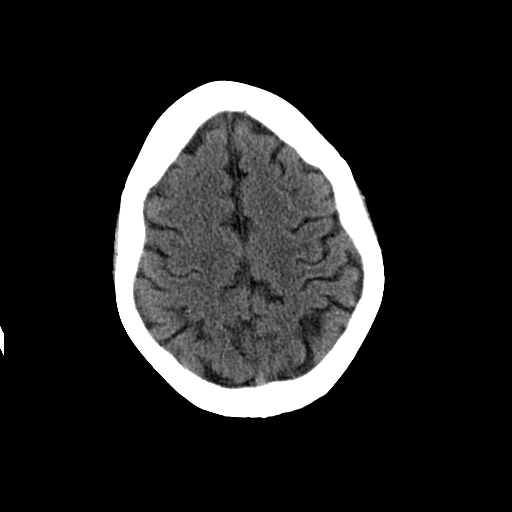
[im 21/30  bone]
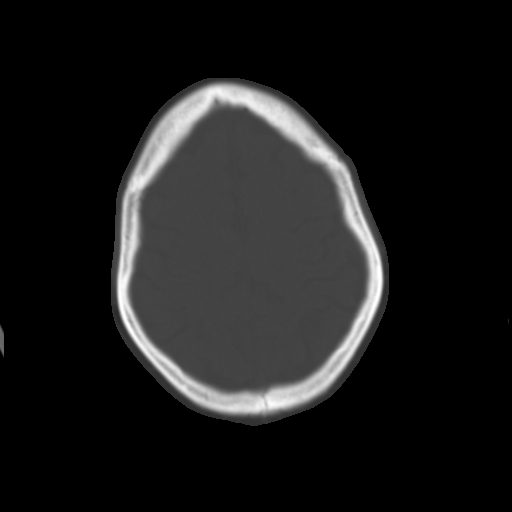
[im 23/30  brain]
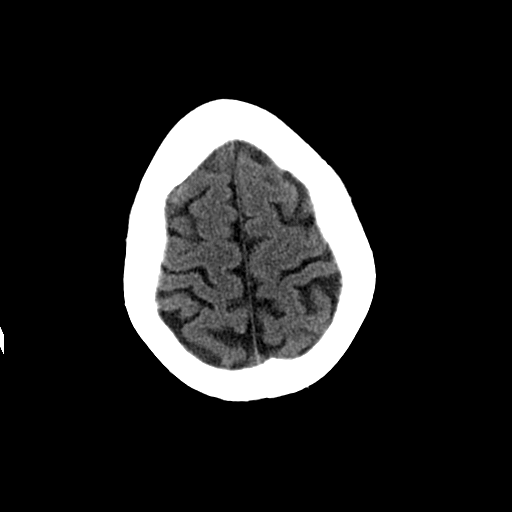
[im 25/30  brain]
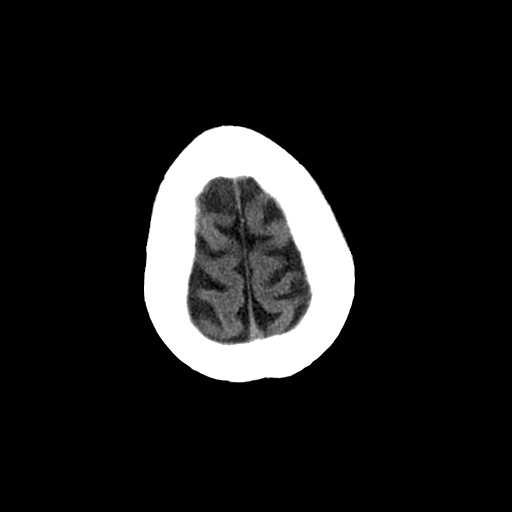
[im 27/30  brain]
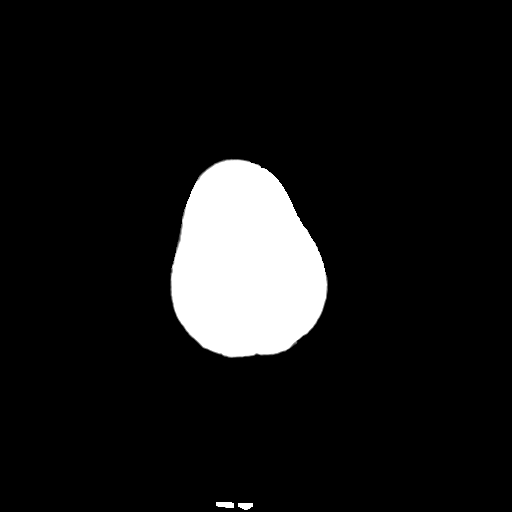

[Series 203: coronal st, idose (1) · coronal · 0.40mm/px · 3 of 70 slices shown]
[im 24/70  brain]
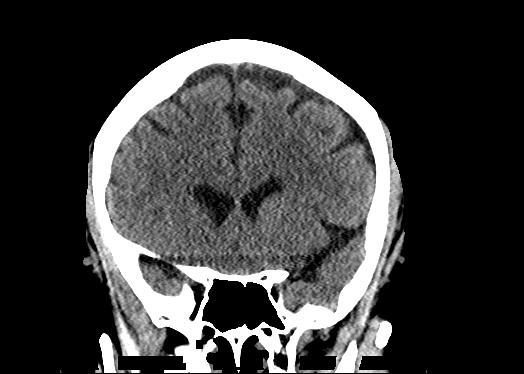
[im 31/70  brain]
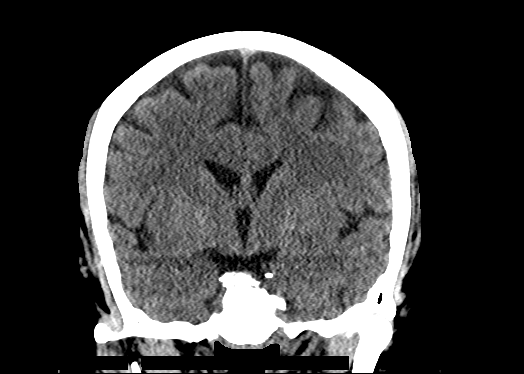
[im 39/70  brain]
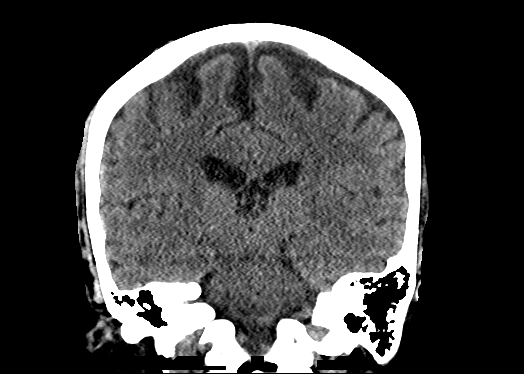

[Series 204: sagittal st, idose (1) · sagittal · 0.40mm/px · 3 of 70 slices shown]
[im 24/70  brain]
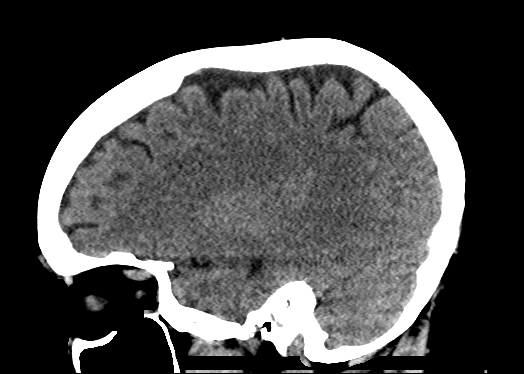
[im 35/70  brain]
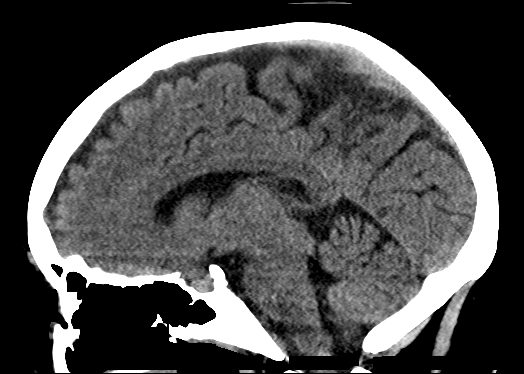
[im 47/70  brain]
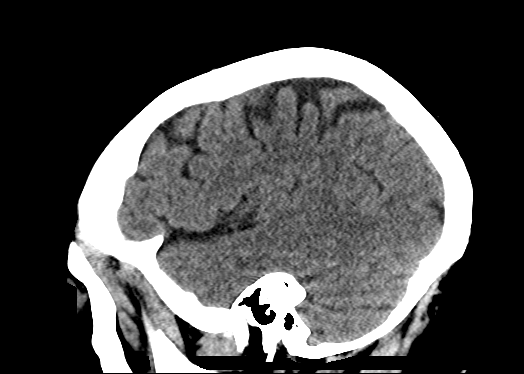

[18 of 47 positions shown; findings below may reference images not displayed]

FINDINGS: No acute intracranial abnormality. Specifically, no hemorrhage,
hydrocephalus, mass lesion, acute infarction, or significant
intracranial injury. No acute calvarial abnormality. Visualized
paranasal sinuses and mastoids clear. Orbital soft tissues
unremarkable.
IMPRESSION: No acute intracranial abnormality.
